# Patient Record
Sex: Male | Born: 1970
Health system: Southern US, Community
[De-identification: ages and names within clinical notes are randomized; demographics above are authoritative.]

## PROBLEM LIST (undated history)

## (undated) DIAGNOSIS — K219 Gastro-esophageal reflux disease without esophagitis: Secondary | ICD-10-CM

## (undated) DIAGNOSIS — S83249A Other tear of medial meniscus, current injury, unspecified knee, initial encounter: Secondary | ICD-10-CM

## (undated) HISTORY — PX: ANTERIOR CRUCIATE LIGAMENT REPAIR: SHX115

## (undated) HISTORY — PX: KNEE ARTHROSCOPY WITH MENISCAL REPAIR: SHX5653

## (undated) HISTORY — PX: ORIF CLAVICLE FRACTURE: SUR924

---

## 2012-02-17 ENCOUNTER — Encounter (HOSPITAL_COMMUNITY): Payer: Self-pay | Admitting: *Deleted

## 2012-02-17 ENCOUNTER — Emergency Department (HOSPITAL_COMMUNITY)
Admission: EM | Admit: 2012-02-17 | Discharge: 2012-02-17 | Disposition: A | Discharge status: Home / Self Care | Payer: Managed Care, Other (non HMO) | Attending: Emergency Medicine | Admitting: Emergency Medicine

## 2012-02-17 DIAGNOSIS — K219 Gastro-esophageal reflux disease without esophagitis: Secondary | ICD-10-CM | POA: Insufficient documentation

## 2012-02-17 DIAGNOSIS — R197 Diarrhea, unspecified: Secondary | ICD-10-CM | POA: Insufficient documentation

## 2012-02-17 HISTORY — DX: Gastro-esophageal reflux disease without esophagitis: K21.9

## 2012-02-17 LAB — URINALYSIS, ROUTINE W REFLEX MICROSCOPIC
Bilirubin Urine: NEGATIVE
Glucose, UA: NEGATIVE mg/dL
Hgb urine dipstick: NEGATIVE
Specific Gravity, Urine: 1.016 (ref 1.005–1.030)
pH: 6 (ref 5.0–8.0)

## 2012-02-17 LAB — CBC WITH DIFFERENTIAL/PLATELET
Eosinophils Absolute: 0 10*3/uL (ref 0.0–0.7)
Eosinophils Relative: 0 % (ref 0–5)
Hemoglobin: 15.3 g/dL (ref 13.0–17.0)
Lymphocytes Relative: 9 % — ABNORMAL LOW (ref 12–46)
Lymphs Abs: 1 10*3/uL (ref 0.7–4.0)
MCH: 30.5 pg (ref 26.0–34.0)
MCV: 83.7 fL (ref 78.0–100.0)
Monocytes Relative: 10 % (ref 3–12)
RBC: 5.02 MIL/uL (ref 4.22–5.81)
WBC: 11.4 10*3/uL — ABNORMAL HIGH (ref 4.0–10.5)

## 2012-02-17 LAB — COMPREHENSIVE METABOLIC PANEL
ALT: 49 U/L (ref 0–53)
AST: 32 U/L (ref 0–37)
Albumin: 4.2 g/dL (ref 3.5–5.2)
Alkaline Phosphatase: 104 U/L (ref 39–117)
Glucose, Bld: 107 mg/dL — ABNORMAL HIGH (ref 70–99)
Potassium: 3.5 mEq/L (ref 3.5–5.1)
Sodium: 133 mEq/L — ABNORMAL LOW (ref 135–145)
Total Protein: 7.5 g/dL (ref 6.0–8.3)

## 2012-02-17 MED ORDER — SODIUM CHLORIDE 0.9 % IV SOLN
1000.0000 mL | INTRAVENOUS | Status: DC
  Administered 2012-02-17: 1000 mL via INTRAVENOUS

## 2012-02-17 MED ORDER — SODIUM CHLORIDE 0.9 % IV SOLN
1000.0000 mL | Freq: Once | INTRAVENOUS | Status: AC
  Administered 2012-02-17: 1000 mL via INTRAVENOUS

## 2012-02-17 MED ORDER — ONDANSETRON HCL 4 MG/2ML IJ SOLN
4.0000 mg | Freq: Once | INTRAMUSCULAR | Status: AC
  Administered 2012-02-17: 4 mg via INTRAVENOUS
  Filled 2012-02-17: qty 2

## 2012-02-17 MED ORDER — LOPERAMIDE HCL 2 MG PO CAPS: 2.0000 mg | ORAL_CAPSULE | Freq: Four times a day (QID) | ORAL | Status: AC | PRN

## 2012-02-17 MED ORDER — ONDANSETRON 8 MG PO TBDP: 8.0000 mg | ORAL_TABLET | Freq: Three times a day (TID) | ORAL | Status: AC | PRN

## 2012-02-17 MED ORDER — MORPHINE SULFATE 4 MG/ML IJ SOLN
4.0000 mg | Freq: Once | INTRAMUSCULAR | Status: AC
  Administered 2012-02-17: 4 mg via INTRAVENOUS
  Filled 2012-02-17: qty 1

## 2012-02-17 NOTE — ED Notes (Signed)
Pt reports Thursday morning had body aches, lethargy and symptoms improved Friday until after dinner. Pt states started to have diarrhea Friday night. Pt states nausea and vomiting starting this AM.  Pt also reports generalized lower abdominal pain. Pt denies history of similar symptoms.  Pt has not been taking anything for symptoms.

## 2012-02-17 NOTE — ED Provider Notes (Signed)
History     CSN: [id number]  Arrival date & time 02/17/12  1108   First MD Initiated Contact with Patient 02/17/12 1133      Chief Complaint  Patient presents with  . Abdominal Pain  . Nausea  . Emesis  . Diarrhea  . Generalized Body Aches  . Fever  . Headache    HPI Pt started having body aches on Thursday.  Thursday and Friday started with diarrhea.  He has had maybe 40 episodes.  No blood in the diarrhea.  Watery and mucus.   Today he started with vomiting.  No blood in urine.  Nothing seems to help.  The symptoms are worsening.  The abdominal pain is cramping all over but mostly toward the lower abdomen.  He also has a headache.  No neck pain.  No travelling.  No camping.  No recent antibiotics. Pt did eat lunch at [facility] this week.  He thought the shrimp smelled fishy.  Past Medical History  Diagnosis Date  . Acid reflux     Past Surgical History  Procedure Date  . Anterior cruciate ligament repair   . Meniscectomy   . Resection distal clavical     No family history on file.  History  Substance Use Topics  . Smoking status: Never Smoker   . Smokeless tobacco: Never Used  . Alcohol Use: Yes      Review of Systems  All other systems reviewed and are negative.    Allergies  Review of patient's allergies indicates no known allergies.  Home Medications  No current outpatient prescriptions on file.  BP 116/70  Pulse 99  Temp 100.8 F (38.2 C)  Resp 20  Wt 164 lb (74.39 kg)  SpO2 100%  Physical Exam  Nursing note and vitals reviewed. Constitutional: He appears well-developed and well-nourished. No distress.  HENT:  Head: Normocephalic and atraumatic.  Right Ear: External ear normal.  Left Ear: External ear normal.  Eyes: Conjunctivae are normal. Right eye exhibits no discharge. Left eye exhibits no discharge. No scleral icterus.  Neck: Full passive range of motion without pain. Neck supple. No rigidity. No tracheal deviation present.    Cardiovascular: Normal rate, regular rhythm and intact distal pulses.   Pulmonary/Chest: Effort normal and breath sounds normal. No stridor. No respiratory distress. He has no wheezes. He has no rales.  Abdominal: Soft. Bowel sounds are normal. He exhibits no distension. There is no tenderness. There is no rebound and no guarding.  Musculoskeletal: He exhibits no edema and no tenderness.  Neurological: He is alert. He has normal strength. No sensory deficit. Cranial nerve deficit:  no gross defecits noted. He exhibits normal muscle tone. He displays no seizure activity. Coordination normal.  Skin: Skin is warm and dry. No rash noted.  Psychiatric: He has a normal mood and affect.    ED Course  Procedures (including critical care time)  Labs Reviewed  COMPREHENSIVE METABOLIC PANEL - Abnormal; Notable for the following:    Sodium 133 (*)     Chloride 95 (*)     Glucose, Bld 107 (*)     All other components within normal limits  URINALYSIS, ROUTINE W REFLEX MICROSCOPIC - Abnormal; Notable for the following:    Ketones, ur TRACE (*)     All other components within normal limits  CBC WITH DIFFERENTIAL - Abnormal; Notable for the following:    WBC 11.4 (*)     MCHC 36.4 (*)     Neutrophils Relative  81 (*)     Neutro Abs 9.2 (*)     Lymphocytes Relative 9 (*)     Monocytes Absolute 1.2 (*)     All other components within normal limits  LIPASE, BLOOD  STOOL CULTURE   No results found.   1. Diarrhea       MDM  Pt without significant tenderness on exam. NO distress here.  No vomiting.  Pt has had multiple episodes of diarrhea.  ?food borne illness.  Pt has not had a diarrhea episode since 9am.  Will dc home with antiemetics and antidiarrheal agents to be used prn. Pt feels better.  At this time there does not appear to be any evidence of an acute emergency medical condition and the patient appears stable for discharge with appropriate outpatient follow up.         Celene Kras, MD 02/17/12 [phone]

## 2015-05-17 DIAGNOSIS — S83249A Other tear of medial meniscus, current injury, unspecified knee, initial encounter: Secondary | ICD-10-CM

## 2015-05-17 HISTORY — DX: Other tear of medial meniscus, current injury, unspecified knee, initial encounter: S83.249A

## 2015-06-01 ENCOUNTER — Other Ambulatory Visit: Payer: Self-pay | Admitting: Sports Medicine

## 2015-06-01 DIAGNOSIS — M25561 Pain in right knee: Secondary | ICD-10-CM

## 2015-06-02 ENCOUNTER — Ambulatory Visit
Admission: RE | Admit: 2015-06-02 | Discharge: 2015-06-02 | Disposition: A | Discharge status: Home / Self Care | Payer: BLUE CROSS/BLUE SHIELD | Source: Ambulatory Visit | Attending: Sports Medicine | Admitting: Sports Medicine

## 2015-06-02 DIAGNOSIS — M25561 Pain in right knee: Secondary | ICD-10-CM

## 2015-06-06 ENCOUNTER — Encounter (HOSPITAL_BASED_OUTPATIENT_CLINIC_OR_DEPARTMENT_OTHER): Payer: Self-pay | Admitting: *Deleted

## 2015-06-06 ENCOUNTER — Other Ambulatory Visit (HOSPITAL_BASED_OUTPATIENT_CLINIC_OR_DEPARTMENT_OTHER): Payer: Self-pay | Admitting: Orthopaedic Surgery

## 2015-06-08 ENCOUNTER — Ambulatory Visit (HOSPITAL_BASED_OUTPATIENT_CLINIC_OR_DEPARTMENT_OTHER): Payer: BLUE CROSS/BLUE SHIELD | Admitting: Certified Registered"

## 2015-06-08 ENCOUNTER — Encounter (HOSPITAL_BASED_OUTPATIENT_CLINIC_OR_DEPARTMENT_OTHER): Payer: Self-pay | Admitting: Certified Registered"

## 2015-06-08 ENCOUNTER — Ambulatory Visit (HOSPITAL_BASED_OUTPATIENT_CLINIC_OR_DEPARTMENT_OTHER)
Admission: RE | Admit: 2015-06-08 | Discharge: 2015-06-08 | Disposition: A | Discharge status: Home / Self Care | Payer: BLUE CROSS/BLUE SHIELD | Source: Ambulatory Visit | Attending: Orthopaedic Surgery | Admitting: Orthopaedic Surgery

## 2015-06-08 ENCOUNTER — Encounter (HOSPITAL_BASED_OUTPATIENT_CLINIC_OR_DEPARTMENT_OTHER)
Admission: RE | Disposition: A | Discharge status: Home / Self Care | Payer: Self-pay | Source: Ambulatory Visit | Attending: Orthopaedic Surgery

## 2015-06-08 DIAGNOSIS — K219 Gastro-esophageal reflux disease without esophagitis: Secondary | ICD-10-CM | POA: Insufficient documentation

## 2015-06-08 DIAGNOSIS — M65861 Other synovitis and tenosynovitis, right lower leg: Secondary | ICD-10-CM | POA: Insufficient documentation

## 2015-06-08 DIAGNOSIS — M23231 Derangement of other medial meniscus due to old tear or injury, right knee: Secondary | ICD-10-CM | POA: Insufficient documentation

## 2015-06-08 HISTORY — DX: Other tear of medial meniscus, current injury, unspecified knee, initial encounter: S83.249A

## 2015-06-08 HISTORY — PX: KNEE ARTHROSCOPY WITH MEDIAL MENISECTOMY: SHX5651

## 2015-06-08 SURGERY — ARTHROSCOPY, KNEE, WITH MEDIAL MENISCECTOMY
Anesthesia: General | Site: Knee | Laterality: Right

## 2015-06-08 MED ORDER — MIDAZOLAM HCL 2 MG/2ML IJ SOLN
1.0000 mg | INTRAMUSCULAR | Status: DC | PRN
  Administered 2015-06-08: 2 mg via INTRAVENOUS
  Administered 2015-06-08: 1 mg via INTRAVENOUS

## 2015-06-08 MED ORDER — CEFAZOLIN SODIUM-DEXTROSE 2-3 GM-% IV SOLR
INTRAVENOUS | Status: AC
  Filled 2015-06-08: qty 50

## 2015-06-08 MED ORDER — PROPOFOL 10 MG/ML IV BOLUS
INTRAVENOUS | Status: DC | PRN
  Administered 2015-06-08: 200 mg via INTRAVENOUS

## 2015-06-08 MED ORDER — HYDROCODONE-ACETAMINOPHEN 5-325 MG PO TABS: 1.0000 | ORAL_TABLET | Freq: Four times a day (QID) | ORAL | Status: AC | PRN

## 2015-06-08 MED ORDER — KETOROLAC TROMETHAMINE 30 MG/ML IJ SOLN
INTRAMUSCULAR | Status: AC
  Filled 2015-06-08: qty 1

## 2015-06-08 MED ORDER — PROMETHAZINE HCL 25 MG/ML IJ SOLN: 6.2500 mg | INTRAMUSCULAR | Status: DC | PRN

## 2015-06-08 MED ORDER — FENTANYL CITRATE (PF) 100 MCG/2ML IJ SOLN
INTRAMUSCULAR | Status: AC
  Filled 2015-06-08: qty 2

## 2015-06-08 MED ORDER — CEFAZOLIN SODIUM-DEXTROSE 2-3 GM-% IV SOLR
2.0000 g | INTRAVENOUS | Status: AC
  Administered 2015-06-08: 2 g via INTRAVENOUS

## 2015-06-08 MED ORDER — ONDANSETRON HCL 4 MG/2ML IJ SOLN
INTRAMUSCULAR | Status: AC
  Filled 2015-06-08: qty 2

## 2015-06-08 MED ORDER — BUPIVACAINE HCL (PF) 0.25 % IJ SOLN
INTRAMUSCULAR | Status: AC
  Filled 2015-06-08: qty 30

## 2015-06-08 MED ORDER — LIDOCAINE HCL (CARDIAC) 20 MG/ML IV SOLN
INTRAVENOUS | Status: DC | PRN
  Administered 2015-06-08: 60 mg via INTRAVENOUS

## 2015-06-08 MED ORDER — FENTANYL CITRATE (PF) 100 MCG/2ML IJ SOLN
25.0000 ug | INTRAMUSCULAR | Status: DC | PRN
  Administered 2015-06-08 (×2): 50 ug via INTRAVENOUS

## 2015-06-08 MED ORDER — MIDAZOLAM HCL 2 MG/2ML IJ SOLN
INTRAMUSCULAR | Status: AC
  Filled 2015-06-08: qty 2

## 2015-06-08 MED ORDER — ONDANSETRON HCL 4 MG/2ML IJ SOLN
INTRAMUSCULAR | Status: DC | PRN
  Administered 2015-06-08: 4 mg via INTRAVENOUS

## 2015-06-08 MED ORDER — SCOPOLAMINE 1 MG/3DAYS TD PT72: 1.0000 | MEDICATED_PATCH | Freq: Once | TRANSDERMAL | Status: DC | PRN

## 2015-06-08 MED ORDER — SODIUM CHLORIDE 0.9 % IR SOLN
Status: DC | PRN
  Administered 2015-06-08: 6000 mL

## 2015-06-08 MED ORDER — DEXAMETHASONE SODIUM PHOSPHATE 4 MG/ML IJ SOLN
INTRAMUSCULAR | Status: DC | PRN
  Administered 2015-06-08: 10 mg via INTRAVENOUS

## 2015-06-08 MED ORDER — GLYCOPYRROLATE 0.2 MG/ML IJ SOLN: 0.2000 mg | Freq: Once | INTRAMUSCULAR | Status: DC | PRN

## 2015-06-08 MED ORDER — DEXAMETHASONE SODIUM PHOSPHATE 10 MG/ML IJ SOLN
INTRAMUSCULAR | Status: AC
  Filled 2015-06-08: qty 1

## 2015-06-08 MED ORDER — FENTANYL CITRATE (PF) 100 MCG/2ML IJ SOLN
50.0000 ug | INTRAMUSCULAR | Status: DC | PRN
  Administered 2015-06-08: 100 ug via INTRAVENOUS

## 2015-06-08 MED ORDER — BUPIVACAINE HCL 0.25 % IJ SOLN
INTRAMUSCULAR | Status: DC | PRN
  Administered 2015-06-08: 17 mL

## 2015-06-08 MED ORDER — LACTATED RINGERS IV SOLN
INTRAVENOUS | Status: DC
  Administered 2015-06-08: 10 mL/h via INTRAVENOUS
  Administered 2015-06-08: 10:00:00 via INTRAVENOUS

## 2015-06-08 MED ORDER — KETOROLAC TROMETHAMINE 30 MG/ML IJ SOLN
30.0000 mg | Freq: Once | INTRAMUSCULAR | Status: AC
  Administered 2015-06-08: 30 mg via INTRAVENOUS

## 2015-06-08 MED ORDER — LIDOCAINE HCL (CARDIAC) 20 MG/ML IV SOLN
INTRAVENOUS | Status: AC
  Filled 2015-06-08: qty 5

## 2015-06-08 SURGICAL SUPPLY — 38 items
BANDAGE ELASTIC 6 VELCRO ST LF (GAUZE/BANDAGES/DRESSINGS) ×3 IMPLANT
BANDAGE ESMARK 6X9 LF (GAUZE/BANDAGES/DRESSINGS) IMPLANT
BLADE 4.2CUDA (BLADE) ×3 IMPLANT
BLADE CUDA GRT WHITE 3.5 (BLADE) ×3 IMPLANT
BLADE CUDA SHAVER 3.5 (BLADE) IMPLANT
BLADE CUTTER GATOR 3.5 (BLADE) IMPLANT
BNDG ESMARK 6X9 LF (GAUZE/BANDAGES/DRESSINGS)
CUFF TOURNIQUET SINGLE 34IN LL (TOURNIQUET CUFF) IMPLANT
DRAPE ARTHROSCOPY W/POUCH 90 (DRAPES) ×3 IMPLANT
DRAPE SURG 17X23 STRL (DRAPES) ×6 IMPLANT
DURAPREP 26ML APPLICATOR (WOUND CARE) ×3 IMPLANT
ELECT MENISCUS 165MM 90D (ELECTRODE) IMPLANT
ELECT REM PT RETURN 9FT ADLT (ELECTROSURGICAL)
ELECTRODE REM PT RTRN 9FT ADLT (ELECTROSURGICAL) IMPLANT
GAUZE SPONGE 4X4 12PLY STRL (GAUZE/BANDAGES/DRESSINGS) ×3 IMPLANT
GAUZE XEROFORM 1X8 LF (GAUZE/BANDAGES/DRESSINGS) ×3 IMPLANT
GLOVE BIOGEL PI IND STRL 7.0 (GLOVE) ×2 IMPLANT
GLOVE BIOGEL PI INDICATOR 7.0 (GLOVE) ×4
GLOVE ECLIPSE 6.5 STRL STRAW (GLOVE) ×3 IMPLANT
GLOVE NEODERM STRL 7.5 LF PF (GLOVE) ×1 IMPLANT
GLOVE SURG NEODERM 7.5  LF PF (GLOVE) ×2
GLOVE SURG SYN 7.5  E (GLOVE) ×2
GLOVE SURG SYN 7.5 E (GLOVE) ×1 IMPLANT
GOWN STRL REIN XL XLG (GOWN DISPOSABLE) ×3 IMPLANT
GOWN STRL REUS W/ TWL LRG LVL3 (GOWN DISPOSABLE) ×1 IMPLANT
GOWN STRL REUS W/TWL LRG LVL3 (GOWN DISPOSABLE) ×2
KNEE WRAP E Z 3 GEL PACK (MISCELLANEOUS) ×3 IMPLANT
MANIFOLD NEPTUNE II (INSTRUMENTS) ×3 IMPLANT
PACK ARTHROSCOPY DSU (CUSTOM PROCEDURE TRAY) ×3 IMPLANT
PACK BASIN DAY SURGERY FS (CUSTOM PROCEDURE TRAY) ×3 IMPLANT
PENCIL BUTTON HOLSTER BLD 10FT (ELECTRODE) IMPLANT
RESECTOR FULL RADIUS 4.2MM (BLADE) IMPLANT
SLEEVE SCD COMPRESS KNEE MED (MISCELLANEOUS) IMPLANT
SUT ETHILON 3 0 PS 1 (SUTURE) ×3 IMPLANT
TOWEL OR 17X24 6PK STRL BLUE (TOWEL DISPOSABLE) ×3 IMPLANT
TOWEL OR NON WOVEN STRL DISP B (DISPOSABLE) IMPLANT
TUBING ARTHROSCOPY IRRIG 16FT (MISCELLANEOUS) ×3 IMPLANT
WATER STERILE IRR 1000ML POUR (IV SOLUTION) ×3 IMPLANT

## 2015-06-08 NOTE — Anesthesia Preprocedure Evaluation (Signed)
Anesthesia Evaluation  Patient identified by MRN, date of birth, ID band Patient awake    Reviewed: Allergy & Precautions, NPO status , Patient's Chart, lab work & pertinent test results  Airway Mallampati: II  TM Distance: >3 FB Neck ROM: Full    Dental no notable dental hx.    Pulmonary neg pulmonary ROS,    Pulmonary exam normal breath sounds clear to auscultation       Cardiovascular negative cardio ROS Normal cardiovascular exam Rhythm:Regular Rate:Normal     Neuro/Psych negative neurological ROS  negative psych ROS   GI/Hepatic Neg liver ROS, GERD  Medicated,  Endo/Other  negative endocrine ROS  Renal/GU negative Renal ROS  negative genitourinary   Musculoskeletal negative musculoskeletal ROS (+)   Abdominal   Peds negative pediatric ROS (+)  Hematology negative hematology ROS (+)   Anesthesia Other Findings   Reproductive/Obstetrics negative OB ROS                             Anesthesia Physical Anesthesia Plan  ASA: II  Anesthesia Plan: General   Post-op Pain Management:    Induction: Intravenous  Airway Management Planned: LMA  Additional Equipment:   Intra-op Plan:   Post-operative Plan: Extubation in OR  Informed Consent: I have reviewed the patients History and Physical, chart, labs and discussed the procedure including the risks, benefits and alternatives for the proposed anesthesia with the patient or authorized representative who has indicated his/her understanding and acceptance.   Dental advisory given  Plan Discussed with: CRNA and Surgeon  Anesthesia Plan Comments:         Anesthesia Quick Evaluation  

## 2015-06-08 NOTE — H&P (Signed)
    PREOPERATIVE H&P  Chief Complaint: Right knee medial meniscal tear  HPI: Jerome Cole is a 44 y.o. male who presents for surgical treatment of Right knee medial meniscal tear.  He denies any changes in medical history.  Past Medical History  Diagnosis Date  . Acid reflux   . Medial meniscus tear 05/2015    right knee   Past Surgical History  Procedure Laterality Date  . Anterior cruciate ligament repair Bilateral   . Orif clavicle fracture    . Knee arthroscopy with meniscal repair Left    Social History   Social History  . Marital Status: Married    Spouse Name: N/A  . Number of Children: N/A  . Years of Education: N/A   Social History Main Topics  . Smoking status: Never Smoker   . Smokeless tobacco: Never Used  . Alcohol Use: Yes     Comment: occasionally  . Drug Use: No  . Sexual Activity: Not Asked   Other Topics Concern  . None   Social History Narrative   History reviewed. No pertinent family history. No Known Allergies Prior to Admission medications   Medication Sig Start Date End Date Taking? Authorizing Provider  ibuprofen (ADVIL,MOTRIN) 200 MG tablet Take 200 mg by mouth every 6 (six) hours as needed.   Yes Historical Provider, MD  omeprazole (PRILOSEC) 20 MG capsule Take 20 mg by mouth daily.   Yes Historical Provider, MD     Positive ROS: All other systems have been reviewed and were otherwise negative with the exception of those mentioned in the HPI and as above.  Physical Exam: General: Alert, no acute distress Cardiovascular: No pedal edema Respiratory: No cyanosis, no use of accessory musculature GI: abdomen soft Skin: No lesions in the area of chief complaint Neurologic: Sensation intact distally Psychiatric: Patient is competent for consent with normal mood and affect Lymphatic: no lymphedema  MUSCULOSKELETAL: exam stable  Assessment: Right knee medial meniscal tear  Plan: Plan for Procedure(s): RIGHT KNEE ARTHROSCOPY WITH  PARTIAL MEDIAL MENISCECTOMY  The risks benefits and alternatives were discussed with the patient including but not limited to the risks of nonoperative treatment, versus surgical intervention including infection, bleeding, nerve injury,  blood clots, cardiopulmonary complications, morbidity, mortality, among others, and they were willing to proceed.   Cheral AlmasXu, Naiping Michael, MD   06/08/2015 6:47 AM

## 2015-06-08 NOTE — Discharge Instructions (Signed)
° ° °Post-operative patient instructions  °Knee Arthroscopy  ° °• Ice:  Place intermittent ice or cooler pack over your knee, 30 minutes on and 30 minutes off.  Continue this for the first 72 hours after surgery, then save ice for use after therapy sessions or on more active days.   °• Weight:  You may bear weight on your leg as your symptoms allow. °• Crutches:  Use crutches (or walker) to assist in walking until told to discontinue by your physical therapist or physician. This will help to reduce pain. °• Strengthening:  Perform simple thigh squeezes (isometric quad contractions) and straight leg lifts as you are able (3 sets of 5 to 10 repetitions, 3 times a day).  For the leg lifts, have someone support under your ankle in the beginning until you have increased strength enough to do this on your own.  To help get started on thigh squeezes, place a pillow under your knee and push down on the pillow with back of knee (sometimes easier to do than with your leg fully straight). °• Motion:  Perform gentle knee motion as tolerated - this is gentle bending and straightening of the knee. Seated heel slides: you can start by sitting in a chair, remove your brace, and gently slide your heel back on the floor - allowing your knee to bend. Have someone help you straighten your knee (or use your other leg/foot hooked under your ankle.  °• Dressing:  Perform 1st dressing change at 2 days postoperative. A moderate amount of blood tinged drainage is to be expected.  So if you bleed through the dressing on the first or second day or if you have fevers, it is fine to change the dressing/check the wounds early and redress wound. Elevate your leg.  If it bleeds through again, or if the incisions are leaking frank blood, please call the office. May change dressing every 1-2 days thereafter to help watch wounds. Can purchase Tegderm (or 3M Nexcare) water resistant dressings at local pharmacy / [facility]. °• Shower:  Light shower is ok  after 2 days.  Please take shower, NO bath. Recover with gauze and ace wrap to help keep wounds protected.   °• Pain medication:  A narcotic pain medication has been prescribed.  Take as directed.  Typically you need narcotic pain medication more regularly during the first 3 to 5 days after surgery.  Decrease your use of the medication as the pain improves.  Narcotics can sometimes cause constipation, even after a few doses.  If you have problems with constipation, you can take an over the counter stool softener or light laxative.  If you have persistent problems, please notify your physician’s office. °• Physical therapy: Additional activity guidelines to be provided by your physician or physical therapist at follow-up visits.  °• Driving: Do not recommend driving x 2 weeks post surgical, especially if surgery performed on right side. Should not drive while taking narcotic pain medications. It typically takes at least 2 weeks to restore sufficient neuromuscular function for normal reaction times for driving safety.  °• Call [phone] for questions or problems. Evenings you will be forwarded to the hospital operator.  Ask for the orthopaedic physician on call. Please call if you experience:  °  °o Redness, foul smelling, or persistent drainage from the surgical site  °o worsening knee pain and swelling not responsive to medication  °o any calf pain and or swelling of the lower leg  °o temperatures greater than   100.5 F °o other questions or concerns ° ° °Thank you for allowing us to be a part of your care. ° °

## 2015-06-08 NOTE — Anesthesia Procedure Notes (Signed)
Procedure Name: LMA Insertion Date/Time: 06/08/2015 9:24 AM Performed by: Curly ShoresRAFT, Lunell Robart W Pre-anesthesia Checklist: Patient identified, Emergency Drugs available, Suction available and Patient being monitored Patient Re-evaluated:Patient Re-evaluated prior to inductionOxygen Delivery Method: Circle System Utilized Preoxygenation: Pre-oxygenation with 100% oxygen Intubation Type: IV induction Ventilation: Mask ventilation without difficulty LMA: LMA inserted LMA Size: 4.0 Number of attempts: 1 Airway Equipment and Method: Bite block Placement Confirmation: positive ETCO2 and breath sounds checked- equal and bilateral Tube secured with: Tape Dental Injury: Teeth and Oropharynx as per pre-operative assessment

## 2015-06-08 NOTE — Transfer of Care (Signed)
Immediate Anesthesia Transfer of Care Note  Patient: Jerome Cole  Procedure(s) Performed: Procedure(s): RIGHT KNEE ARTHROSCOPY WITH PARTIAL MEDIAL MENISCECTOMY (Right)  Patient Location: PACU  Anesthesia Type:General  Level of Consciousness: awake, sedated and responds to stimulation  Airway & Oxygen Therapy: Patient Spontanous Breathing and Patient connected to face mask oxygen  Post-op Assessment: Report given to RN, Post -op Vital signs reviewed and stable and Patient moving all extremities  Post vital signs: Reviewed and stable  Last Vitals:  Filed Vitals:   06/08/15 0841  BP: 158/83  Pulse: 68  Temp: 36.6 C  Resp: 20    Complications: No apparent anesthesia complications

## 2015-06-08 NOTE — Op Note (Signed)
   Surgery Date: 06/08/2015  Surgeon(s): Naiping Donnelly StagerM Xu, MD  ANESTHESIA:  general  FLUIDS: Per anesthesia record.   ESTIMATED BLOOD LOSS: minimal  PREOPERATIVE DIAGNOSES:  1. Right knee medial meniscus tear 2.  knee synovitis  POSTOPERATIVE DIAGNOSES:  same  PROCEDURES PERFORMED:  1. right knee arthroscopy with limited synovectomy 2. knee arthroscopy with arthroscopic partial right meniscectomy  DESCRIPTION OF PROCEDURE: Mr. Kayren EavesLanglois is a 44 y.o.-year-old male with right knee medial meniscus tear. Plans are to proceed with partial medial meniscectomy and diagnostic arthroscopy with debridement as indicated. Full discussion held regarding risks benefits alternatives and complications related surgical intervention. Conservative care options reviewed. All questions answered.  The patient was identified in the preoperative holding area and the operative extremity was marked. The patient was brought to the operating room and transferred to operating table in a supine position. Satisfactory general anesthesia was induced by anesthesiology.    Standard anterolateral, anteromedial arthroscopy portals were obtained. The anteromedial portal was obtained with a spinal needle for localization under direct visualization with subsequent diagnostic findings.   Anteromedial and anterolateral chambers: mild synovitis. The synovitis was debrided with a 4.5 mm full radius shaver through both the anteromedial and lateral portals.   Suprapatellar pouch and gutters: mild synovitis or debris. Patella chondral surface: Grade 0 Trochlear chondral surface: Grade 0 Patellofemoral tracking: normal Medial meniscus: bucket handle tear.  Medial femoral condyle flexion bearing surface: Grade 1 Medial femoral condyle extension bearing surface: Grade 1 Medial tibial plateau: Grade 1 Anterior cruciate ligament:stable Posterior cruciate ligament:stable Lateral meniscus: normal.   Lateral femoral condyle flexion  bearing surface: Grade 0 Lateral femoral condyle extension bearing surface: Grade 0 Lateral tibial plateau: Grade 0  After completion of synovectomy, diagnostic exam, and debridements as described, all compartments were checked and no residual debris remained. Hemostasis was achieved with the cautery wand. The portals were approximated with interrupted nylon suture. All excess fluid was expressed from the joint. The portals were approximated with interrupted nylon suture. Xeroform sterile gauze dressings were applied followed by Ace bandage and ice pack.   DISPOSITION: The patient was awakened from general anesthetic, extubated, taken to the recovery room in medically stable condition, no apparent complications. The patient may be weightbearing as tolerated to the operative lower extremity.  Range of motion of right knee as tolerated.  Mayra ReelN. Michael Xu, MD [facility] [phone] 10:02 AM

## 2015-06-08 NOTE — Anesthesia Postprocedure Evaluation (Signed)
Anesthesia Post Note  Patient: Jerome HatchDave Cole  Procedure(s) Performed: Procedure(s) (LRB): RIGHT KNEE ARTHROSCOPY WITH PARTIAL MEDIAL MENISCECTOMY (Right)  Patient location during evaluation: PACU Anesthesia Type: General Level of consciousness: awake and alert Pain management: pain level controlled Vital Signs Assessment: post-procedure vital signs reviewed and stable Respiratory status: spontaneous breathing, nonlabored ventilation, respiratory function stable and patient connected to nasal cannula oxygen Cardiovascular status: blood pressure returned to baseline and stable Postop Assessment: No signs of nausea or vomiting Anesthetic complications: no    Last Vitals:  Filed Vitals:   06/08/15 0841 06/08/15 1015  BP: 158/83   Pulse: 68 79  Temp: 36.6 C   Resp: 20 14    Last Pain:  Filed Vitals:   06/08/15 1018  PainSc: 4                  Rubby Barbary S

## 2015-06-13 ENCOUNTER — Encounter (HOSPITAL_BASED_OUTPATIENT_CLINIC_OR_DEPARTMENT_OTHER): Payer: Self-pay | Admitting: Orthopaedic Surgery

## 2016-05-10 DIAGNOSIS — M25512 Pain in left shoulder: Secondary | ICD-10-CM | POA: Diagnosis not present

## 2016-05-25 ENCOUNTER — Ambulatory Visit (INDEPENDENT_AMBULATORY_CARE_PROVIDER_SITE_OTHER): Payer: Self-pay

## 2016-05-25 ENCOUNTER — Encounter (INDEPENDENT_AMBULATORY_CARE_PROVIDER_SITE_OTHER): Payer: Self-pay | Admitting: Orthopaedic Surgery

## 2016-05-25 ENCOUNTER — Ambulatory Visit (INDEPENDENT_AMBULATORY_CARE_PROVIDER_SITE_OTHER): Payer: BLUE CROSS/BLUE SHIELD | Admitting: Orthopaedic Surgery

## 2016-05-25 DIAGNOSIS — M79644 Pain in right finger(s): Secondary | ICD-10-CM

## 2016-05-25 DIAGNOSIS — G8929 Other chronic pain: Secondary | ICD-10-CM | POA: Diagnosis not present

## 2016-05-25 DIAGNOSIS — M7542 Impingement syndrome of left shoulder: Secondary | ICD-10-CM | POA: Diagnosis not present

## 2016-05-25 DIAGNOSIS — M25511 Pain in right shoulder: Secondary | ICD-10-CM | POA: Diagnosis not present

## 2016-05-25 NOTE — Progress Notes (Signed)
Office Visit Note   Patient: Jerome HatchDave Cole           Date of Birth: 02/14/1971           MRN: [id number] Visit Date: 05/25/2016              Requested by: Joycelyn RuaStephen Meyers, MD [address] [place], [place] [address] PCP: Joycelyn RuaMEYERS, STEPHEN, MD   Assessment & Plan: Visit Diagnoses:  1. Chronic right shoulder pain   2. Impingement syndrome of left shoulder   3. Pain in right finger(s)     Plan:  - subacromial injection given today - rest, ice, nsaids - f/u 4 weeks for recheck, may consider MRI  Follow-Up Instructions: Return in about 4 weeks (around 06/22/2016) for recheck left shoulder.   Orders:  Orders Placed This Encounter  Procedures  . XR Shoulder Left  . XR Finger Little Right   No orders of the defined types were placed in this encounter.     Procedures: No procedures performed   Clinical Data: No additional findings.   Subjective: Chief Complaint  Patient presents with  . Left Shoulder - Pain  . Right Little Finger - Pain    45 yo male whose knee I scoped comes in for left shoulder pain since knee scope.  Shoulder feels tender just posterior to the posterolateral corner of acromion.  He's been going to the gym which makes pain worse.  Pain doesn't radiate.  Advil helps pain.  prednisone taper also helped.      Review of Systems  Constitutional: Negative.   HENT: Negative.   Eyes: Negative.   Respiratory: Negative.   Cardiovascular: Negative.   Gastrointestinal: Negative.   Endocrine: Negative.   Genitourinary: Negative.   Musculoskeletal: Negative.   Skin: Negative.   Allergic/Immunologic: Negative.   Neurological: Negative.   Hematological: Negative.   Psychiatric/Behavioral: Negative.      Objective: Vital Signs: There were no vitals taken for this visit.  Physical Exam  Constitutional: He is oriented to person, place, and time. He appears well-developed and well-nourished.  HENT:  Head: Normocephalic and atraumatic.  Eyes: EOM  are normal.  Neck: Neck supple.  Cardiovascular: Intact distal pulses.   Pulmonary/Chest: Effort normal.  Abdominal: Soft.  Musculoskeletal:       Left knee: He exhibits no effusion.  Neurological: He is alert and oriented to person, place, and time.  Skin: Skin is warm.  Psychiatric: He has a normal mood and affect. His behavior is normal. Judgment and thought content normal.  Nursing note and vitals reviewed.   Left Knee Exam   Other  Effusion: no effusion present   Right Shoulder Exam   Tenderness  The patient is experiencing tenderness in the acromion.  Range of Motion  The patient has normal right shoulder ROM.  Muscle Strength  The patient has normal right shoulder strength.  Tests  Apprehension: negative Cross Arm: negative Drop Arm: negative Hawkin's test: positive Impingement: positive Sulcus: absent  Other  Sensation: normal Pulse: present  Comments:  Very positive Hawkin's sign      Specialty Comments:  No specialty comments available.  Imaging: No results found.   PMFS History: Patient Active Problem List   Diagnosis Date Noted  . Impingement syndrome of left shoulder 05/25/2016   Past Medical History:  Diagnosis Date  . Acid reflux   . Medial meniscus tear 05/2015   right knee    History reviewed. No pertinent family history.  Past  Surgical History:  Procedure Laterality Date  . ANTERIOR CRUCIATE LIGAMENT REPAIR Bilateral   . KNEE ARTHROSCOPY WITH MEDIAL MENISECTOMY Right 06/08/2015   Procedure: RIGHT KNEE ARTHROSCOPY WITH PARTIAL MEDIAL MENISCECTOMY;  Surgeon: Tarry KosNaiping M Alphonse Asbridge, MD;  Location: [facility];  Service: Orthopedics;  Laterality: Right;  . KNEE ARTHROSCOPY WITH MENISCAL REPAIR Left   . ORIF CLAVICLE FRACTURE     Social History   Occupational History  . Not on file.   Social History Main Topics  . Smoking status: Never Smoker  . Smokeless tobacco: Never Used  . Alcohol use Yes     Comment:  occasionally  . Drug use: No  . Sexual activity: Not on file

## 2016-05-26 ENCOUNTER — Encounter (INDEPENDENT_AMBULATORY_CARE_PROVIDER_SITE_OTHER): Payer: Self-pay | Admitting: Orthopaedic Surgery

## 2016-06-22 ENCOUNTER — Ambulatory Visit (INDEPENDENT_AMBULATORY_CARE_PROVIDER_SITE_OTHER): Payer: BLUE CROSS/BLUE SHIELD | Admitting: Orthopaedic Surgery

## 2016-06-29 ENCOUNTER — Encounter (INDEPENDENT_AMBULATORY_CARE_PROVIDER_SITE_OTHER): Payer: Self-pay

## 2016-06-29 ENCOUNTER — Encounter (INDEPENDENT_AMBULATORY_CARE_PROVIDER_SITE_OTHER): Payer: Self-pay | Admitting: Orthopaedic Surgery

## 2016-06-29 ENCOUNTER — Ambulatory Visit (INDEPENDENT_AMBULATORY_CARE_PROVIDER_SITE_OTHER): Payer: BLUE CROSS/BLUE SHIELD | Admitting: Orthopaedic Surgery

## 2016-06-29 DIAGNOSIS — M79644 Pain in right finger(s): Secondary | ICD-10-CM

## 2016-06-29 DIAGNOSIS — M7542 Impingement syndrome of left shoulder: Secondary | ICD-10-CM

## 2016-06-29 NOTE — Progress Notes (Signed)
   Office Visit Note   Patient: Jerome HatchDave Barna           Date of Birth: 09/08/1970           MRN: [id number] Visit Date: 06/29/2016              Requested by: Joycelyn RuaStephen Meyers, MD [address] [place], [place] [address] PCP: Joycelyn RuaMEYERS, STEPHEN, MD   Assessment & Plan: Visit Diagnoses: No diagnosis found.  Plan: In terms of the shoulder I think he should continue to rest and relatively and modify his workout based on this. For the finger looks okay think he likely just fusing sprained it needs to treat this symptomatically should get better as several weeks to months see him back as needed  Follow-Up Instructions: Return if symptoms worsen or fail to improve.   Orders:  No orders of the defined types were placed in this encounter.  No orders of the defined types were placed in this encounter.     Procedures: No procedures performed   Clinical Data: No additional findings.   Subjective: Chief Complaint  Patient presents with  . Left Shoulder - Pain, Follow-up  . Right Little Finger - Pain, Follow-up    Patient follows up today for left shoulder and right small finger pain. The shoulder is about 75% better he is able to do a lot more with the shoulder after the injection. The right small finger continues to be tender to palpation over the DIP joint. Denies any constitutional symptoms.    Review of Systems Complete review of systems negative except for history of present illness  Objective: Vital Signs: There were no vitals taken for this visit.  Physical Exam  Ortho Exam Exam of the left shoulder shows negative Hawkins sign compared to a very positive Hawkins sign previous visit. Rotator cuff is intact. Exam of the right small finger shows slight tenderness over the dorsomedial aspect of the DIP slightly swollen signs see any signs of infection or any aggressive features. He has full range of motion. Specialty Comments:  No specialty comments  available.  Imaging: No results found.   PMFS History: Patient Active Problem List   Diagnosis Date Noted  . Impingement syndrome of left shoulder 05/25/2016   Past Medical History:  Diagnosis Date  . Acid reflux   . Medial meniscus tear 05/2015   right knee    History reviewed. No pertinent family history.  Past Surgical History:  Procedure Laterality Date  . ANTERIOR CRUCIATE LIGAMENT REPAIR Bilateral   . KNEE ARTHROSCOPY WITH MEDIAL MENISECTOMY Right 06/08/2015   Procedure: RIGHT KNEE ARTHROSCOPY WITH PARTIAL MEDIAL MENISCECTOMY;  Surgeon: Tarry KosNaiping M Bayler Nehring, MD;  Location: [facility];  Service: Orthopedics;  Laterality: Right;  . KNEE ARTHROSCOPY WITH MENISCAL REPAIR Left   . ORIF CLAVICLE FRACTURE     Social History   Occupational History  . Not on file.   Social History Main Topics  . Smoking status: Never Smoker  . Smokeless tobacco: Never Used  . Alcohol use Yes     Comment: occasionally  . Drug use: No  . Sexual activity: Not on file

## 2017-01-14 DIAGNOSIS — M542 Cervicalgia: Secondary | ICD-10-CM | POA: Diagnosis not present

## 2017-03-28 DIAGNOSIS — Z23 Encounter for immunization: Secondary | ICD-10-CM | POA: Diagnosis not present

## 2017-08-19 ENCOUNTER — Ambulatory Visit (INDEPENDENT_AMBULATORY_CARE_PROVIDER_SITE_OTHER): Payer: BLUE CROSS/BLUE SHIELD | Admitting: Orthopaedic Surgery

## 2017-08-19 ENCOUNTER — Ambulatory Visit (INDEPENDENT_AMBULATORY_CARE_PROVIDER_SITE_OTHER): Payer: BLUE CROSS/BLUE SHIELD

## 2017-08-19 ENCOUNTER — Encounter (INDEPENDENT_AMBULATORY_CARE_PROVIDER_SITE_OTHER): Payer: Self-pay | Admitting: Orthopaedic Surgery

## 2017-08-19 DIAGNOSIS — M25562 Pain in left knee: Secondary | ICD-10-CM

## 2017-08-19 MED ORDER — METHYLPREDNISOLONE ACETATE 40 MG/ML IJ SUSP
40.0000 mg | INTRAMUSCULAR | Status: AC | PRN
  Administered 2017-08-19: 40 mg via INTRA_ARTICULAR

## 2017-08-19 MED ORDER — BUPIVACAINE HCL 0.5 % IJ SOLN
2.0000 mL | INTRAMUSCULAR | Status: AC | PRN
  Administered 2017-08-19: 2 mL via INTRA_ARTICULAR

## 2017-08-19 MED ORDER — LIDOCAINE HCL 1 % IJ SOLN
2.0000 mL | INTRAMUSCULAR | Status: AC | PRN
  Administered 2017-08-19: 2 mL

## 2017-08-19 NOTE — Progress Notes (Signed)
Office Visit Note   Patient: Jerome Cole           Date of Birth: 17-Jan-1971           MRN: [id number] Visit Date: 08/19/2017              Requested by: Joycelyn Rua, MD [address] [place], [place] [address] PCP: Joycelyn Rua, MD   Assessment & Plan: Visit Diagnoses:  1. Acute pain of left knee     Plan: Impression is 47 year old gentleman with acute left knee injury and effusion.  15 cc of blood-tinged joint fluid was aspirated and then injected with cortisone.  Given prior surgical history and suspicion for medial meniscal tear recommend MRI to fully evaluate for this.  Follow-up after the MRI.  Follow-Up Instructions: Return in about 10 days (around 08/29/2017).   Orders:  Orders Placed This Encounter  Procedures  . XR KNEE 3 VIEW LEFT  . MR Knee Left w/o contrast   No orders of the defined types were placed in this encounter.     Procedures: Large Joint Inj: L knee on 08/19/2017 3:55 PM Details: 22 G needle Medications: 2 mL bupivacaine 0.5 %; 2 mL lidocaine 1 %; 40 mg methylPREDNISolone acetate 40 MG/ML Aspirate: 15 mL blood-tinged Outcome: tolerated well, no immediate complications Patient was prepped and draped in the usual sterile fashion.       Clinical Data: No additional findings.   Subjective: Chief Complaint  Patient presents with  . Left Knee - Pain, Injury, Edema    Jerome Cole is a very pleasant 47 year old gentleman who I took care of couple years ago for a right meniscus tear.  He comes in with a new problem today of an acute left knee injury.  He jumped and when he landed his knee buckled medially and he felt a pop.  He has resultant swelling.  Denies any numbness and tingling.  He is status post ACL reconstruction many years ago.    Review of Systems  Constitutional: Negative.   All other systems reviewed and are negative.    Objective: Vital Signs: There were no vitals taken for this visit.  Physical Exam  Constitutional:  He is oriented to person, place, and time. He appears well-developed and well-nourished.  Pulmonary/Chest: Effort normal.  Abdominal: Soft.  Neurological: He is alert and oriented to person, place, and time.  Skin: Skin is warm.  Psychiatric: He has a normal mood and affect. His behavior is normal. Judgment and thought content normal.  Nursing note and vitals reviewed.   Ortho Exam Left knee exam shows a small joint effusion.  Collaterals  are stable.  1+ Lachman with firm endpoint.  Fully healed surgical scar. Specialty Comments:  No specialty comments available.  Imaging: Xr Knee 3 View Left  Result Date: 08/19/2017 Moderate degenerative joint disease with preserved interference screws from previous ACL reconstruction    PMFS History: Patient Active Problem List   Diagnosis Date Noted  . Pain in right finger(s) 06/29/2016  . Impingement syndrome of left shoulder 05/25/2016   Past Medical History:  Diagnosis Date  . Acid reflux   . Medial meniscus tear 05/2015   right knee    History reviewed. No pertinent family history.  Past Surgical History:  Procedure Laterality Date  . ANTERIOR CRUCIATE LIGAMENT REPAIR Bilateral   . KNEE ARTHROSCOPY WITH MEDIAL MENISECTOMY Right 06/08/2015   Procedure: RIGHT KNEE ARTHROSCOPY WITH PARTIAL MEDIAL MENISCECTOMY;  Surgeon: Tarry Kos, MD;  Location: [facility];  Service: Orthopedics;  Laterality: Right;  . KNEE ARTHROSCOPY WITH MENISCAL REPAIR Left   . ORIF CLAVICLE FRACTURE     Social History   Occupational History  . Not on file  Tobacco Use  . Smoking status: Never Smoker  . Smokeless tobacco: Never Used  Substance and Sexual Activity  . Alcohol use: Yes    Comment: occasionally  . Drug use: No  . Sexual activity: Not on file

## 2017-09-01 ENCOUNTER — Ambulatory Visit
Admission: RE | Admit: 2017-09-01 | Discharge: 2017-09-01 | Disposition: A | Discharge status: Home / Self Care | Payer: BLUE CROSS/BLUE SHIELD | Source: Ambulatory Visit | Attending: Orthopaedic Surgery | Admitting: Orthopaedic Surgery

## 2017-09-01 DIAGNOSIS — M25562 Pain in left knee: Secondary | ICD-10-CM | POA: Diagnosis not present

## 2017-09-05 ENCOUNTER — Ambulatory Visit (INDEPENDENT_AMBULATORY_CARE_PROVIDER_SITE_OTHER): Payer: BLUE CROSS/BLUE SHIELD | Admitting: Orthopaedic Surgery

## 2017-09-05 DIAGNOSIS — M25562 Pain in left knee: Secondary | ICD-10-CM | POA: Diagnosis not present

## 2017-09-05 NOTE — Progress Notes (Signed)
   Office Visit Note   Patient: Jerome Cole           Date of Birth: 02/09/1971           MRN: [id number] Visit Date: 09/05/2017              Requested by: Joycelyn RuaMeyers, Stephen, MD [address] [place], [place] [address] PCP: Joycelyn RuaMeyers, Stephen, MD   Assessment & Plan: Visit Diagnoses:  1. Acute pain of left knee     Plan: Impression left knee pain I suspect that he may have stretched his ACL graft.  He is status post 2 partial medial meniscectomy which may also be irritating him from the recent injury.  Overall his MRI findings did not show any obvious surgical problems.  I would recommend to try to treat this conservatively with a functional ACL brace and 6 weeks of physical therapy for strengthening.  We will recheck him at that time.  Follow-Up Instructions: Return in about 6 weeks (around 10/17/2017).   Orders:  No orders of the defined types were placed in this encounter.  No orders of the defined types were placed in this encounter.     Procedures: No procedures performed   Clinical Data: No additional findings.   Subjective: Chief Complaint  Patient presents with  . Left Knee - Pain    Jerome Cole follows up today for his MRI.  He states that he is slightly improved.  He still has some pain on the medial joint line.  He has some weakness with climbing stairs.  Denies any numbness and tingling.    Review of Systems   Objective: Vital Signs: There were no vitals taken for this visit.  Physical Exam  Ortho Exam Left knee exam shows no joint effusion.  Lachman test is stable with 1+ laxity.  Collaterals are stable.  Negative McMurray. Specialty Comments:  No specialty comments available.  Imaging: No results found.   PMFS History: Patient Active Problem List   Diagnosis Date Noted  . Pain in right finger(s) 06/29/2016  . Impingement syndrome of left shoulder 05/25/2016   Past Medical History:  Diagnosis Date  . Acid reflux   . Medial meniscus tear  05/2015   right knee    No family history on file.  Past Surgical History:  Procedure Laterality Date  . ANTERIOR CRUCIATE LIGAMENT REPAIR Bilateral   . KNEE ARTHROSCOPY WITH MEDIAL MENISECTOMY Right 06/08/2015   Procedure: RIGHT KNEE ARTHROSCOPY WITH PARTIAL MEDIAL MENISCECTOMY;  Surgeon: Tarry KosNaiping M Xu, MD;  Location: [facility];  Service: Orthopedics;  Laterality: Right;  . KNEE ARTHROSCOPY WITH MENISCAL REPAIR Left   . ORIF CLAVICLE FRACTURE     Social History   Occupational History  . Not on file  Tobacco Use  . Smoking status: Never Smoker  . Smokeless tobacco: Never Used  Substance and Sexual Activity  . Alcohol use: Yes    Comment: occasionally  . Drug use: No  . Sexual activity: Not on file

## 2017-09-10 DIAGNOSIS — S83242D Other tear of medial meniscus, current injury, left knee, subsequent encounter: Secondary | ICD-10-CM | POA: Diagnosis not present

## 2017-09-10 DIAGNOSIS — S83512D Sprain of anterior cruciate ligament of left knee, subsequent encounter: Secondary | ICD-10-CM | POA: Diagnosis not present

## 2017-09-13 DIAGNOSIS — S83512D Sprain of anterior cruciate ligament of left knee, subsequent encounter: Secondary | ICD-10-CM | POA: Diagnosis not present

## 2017-09-14 DIAGNOSIS — S83242D Other tear of medial meniscus, current injury, left knee, subsequent encounter: Secondary | ICD-10-CM | POA: Diagnosis not present

## 2017-09-14 DIAGNOSIS — S83512D Sprain of anterior cruciate ligament of left knee, subsequent encounter: Secondary | ICD-10-CM | POA: Diagnosis not present

## 2017-09-16 DIAGNOSIS — S83242D Other tear of medial meniscus, current injury, left knee, subsequent encounter: Secondary | ICD-10-CM | POA: Diagnosis not present

## 2017-09-16 DIAGNOSIS — S83512D Sprain of anterior cruciate ligament of left knee, subsequent encounter: Secondary | ICD-10-CM | POA: Diagnosis not present

## 2017-09-21 DIAGNOSIS — S83512D Sprain of anterior cruciate ligament of left knee, subsequent encounter: Secondary | ICD-10-CM | POA: Diagnosis not present

## 2017-09-21 DIAGNOSIS — S83242D Other tear of medial meniscus, current injury, left knee, subsequent encounter: Secondary | ICD-10-CM | POA: Diagnosis not present

## 2017-09-23 DIAGNOSIS — S83512D Sprain of anterior cruciate ligament of left knee, subsequent encounter: Secondary | ICD-10-CM | POA: Diagnosis not present

## 2017-09-23 DIAGNOSIS — S83242D Other tear of medial meniscus, current injury, left knee, subsequent encounter: Secondary | ICD-10-CM | POA: Diagnosis not present

## 2017-09-27 DIAGNOSIS — S83242D Other tear of medial meniscus, current injury, left knee, subsequent encounter: Secondary | ICD-10-CM | POA: Diagnosis not present

## 2017-09-27 DIAGNOSIS — S83512D Sprain of anterior cruciate ligament of left knee, subsequent encounter: Secondary | ICD-10-CM | POA: Diagnosis not present

## 2017-09-28 DIAGNOSIS — S83242D Other tear of medial meniscus, current injury, left knee, subsequent encounter: Secondary | ICD-10-CM | POA: Diagnosis not present

## 2017-09-28 DIAGNOSIS — S83512D Sprain of anterior cruciate ligament of left knee, subsequent encounter: Secondary | ICD-10-CM | POA: Diagnosis not present

## 2017-10-04 DIAGNOSIS — S83512D Sprain of anterior cruciate ligament of left knee, subsequent encounter: Secondary | ICD-10-CM | POA: Diagnosis not present

## 2017-10-04 DIAGNOSIS — S83242D Other tear of medial meniscus, current injury, left knee, subsequent encounter: Secondary | ICD-10-CM | POA: Diagnosis not present

## 2017-10-07 DIAGNOSIS — S83242D Other tear of medial meniscus, current injury, left knee, subsequent encounter: Secondary | ICD-10-CM | POA: Diagnosis not present

## 2017-10-07 DIAGNOSIS — S83512D Sprain of anterior cruciate ligament of left knee, subsequent encounter: Secondary | ICD-10-CM | POA: Diagnosis not present

## 2017-10-18 ENCOUNTER — Ambulatory Visit (INDEPENDENT_AMBULATORY_CARE_PROVIDER_SITE_OTHER): Payer: BLUE CROSS/BLUE SHIELD | Admitting: Orthopaedic Surgery

## 2017-10-18 ENCOUNTER — Encounter (INDEPENDENT_AMBULATORY_CARE_PROVIDER_SITE_OTHER): Payer: Self-pay | Admitting: Orthopaedic Surgery

## 2017-10-18 DIAGNOSIS — M25562 Pain in left knee: Secondary | ICD-10-CM | POA: Diagnosis not present

## 2017-10-18 DIAGNOSIS — S83242D Other tear of medial meniscus, current injury, left knee, subsequent encounter: Secondary | ICD-10-CM | POA: Diagnosis not present

## 2017-10-18 DIAGNOSIS — S83512D Sprain of anterior cruciate ligament of left knee, subsequent encounter: Secondary | ICD-10-CM | POA: Diagnosis not present

## 2017-10-18 NOTE — Progress Notes (Signed)
   Office Visit Note   Patient: Jerome HatchDave Wrightson           Date of Birth: 02/01/1971           MRN: [id number] Visit Date: 10/18/2017              Requested by: Joycelyn RuaMeyers, Stephen, MD [address] [place], [place] [address] PCP: Joycelyn RuaMeyers, Stephen, MD   Assessment & Plan: Visit Diagnoses:  1. Acute pain of left knee     Plan: At this point, we will have the patient continue working in outpatient physical therapy to regain as much strength as possible.  He will wear his ACL brace with activity.  He will follow-up with us in 6 weeks time for repeat evaluation.  Call if concerns or questions in the meantime.  Follow-Up Instructions: Return in about 6 weeks (around 11/29/2017).   Orders:  No orders of the defined types were placed in this encounter.  No orders of the defined types were placed in this encounter.     Procedures: No procedures performed   Clinical Data: No additional findings.   Subjective: Chief Complaint  Patient presents with  . Left Knee - Pain, Follow-up    HPI patient comes in for follow-up.  He is approximately 8 weeks out from acute injury and pain to the left knee.  MRI was obtained which showed a stretched ACL graft, but nothing that requires surgical intervention.  He was given an ACL brace for activity.  He has been in outpatient physical therapy making slow but steady progress.  He feels that he is regaining some strength but is still very hesitant with activities.   Review of Systems as detailed in HPI.  All others are negative.   Objective: Vital Signs: There were no vitals taken for this visit.  Physical Exam well-developed well-nourished gentleman in no acute distress.  Alert and oriented x3.  Ortho Exam examination of the left knee shows no effusion.  Range of motion 0 to 130 degrees.  Negative anterior drawer.  5 out of 5 strength with resisted straight leg raise.  Specialty Comments:  No specialty comments available.  Imaging: No new  imaging today.   PMFS History: Patient Active Problem List   Diagnosis Date Noted  . Acute pain of left knee 10/18/2017  . Pain in right finger(s) 06/29/2016  . Impingement syndrome of left shoulder 05/25/2016   Past Medical History:  Diagnosis Date  . Acid reflux   . Medial meniscus tear 05/2015   right knee    History reviewed. No pertinent family history.  Past Surgical History:  Procedure Laterality Date  . ANTERIOR CRUCIATE LIGAMENT REPAIR Bilateral   . KNEE ARTHROSCOPY WITH MEDIAL MENISECTOMY Right 06/08/2015   Procedure: RIGHT KNEE ARTHROSCOPY WITH PARTIAL MEDIAL MENISCECTOMY;  Surgeon: Tarry KosNaiping M Xu, MD;  Location: [facility];  Service: Orthopedics;  Laterality: Right;  . KNEE ARTHROSCOPY WITH MENISCAL REPAIR Left   . ORIF CLAVICLE FRACTURE     Social History   Occupational History  . Not on file  Tobacco Use  . Smoking status: Never Smoker  . Smokeless tobacco: Never Used  Substance and Sexual Activity  . Alcohol use: Yes    Comment: occasionally  . Drug use: No  . Sexual activity: Not on file

## 2017-10-21 DIAGNOSIS — F419 Anxiety disorder, unspecified: Secondary | ICD-10-CM | POA: Diagnosis not present

## 2017-10-21 DIAGNOSIS — S83512D Sprain of anterior cruciate ligament of left knee, subsequent encounter: Secondary | ICD-10-CM | POA: Diagnosis not present

## 2017-10-21 DIAGNOSIS — S83242D Other tear of medial meniscus, current injury, left knee, subsequent encounter: Secondary | ICD-10-CM | POA: Diagnosis not present

## 2017-10-26 DIAGNOSIS — S83242D Other tear of medial meniscus, current injury, left knee, subsequent encounter: Secondary | ICD-10-CM | POA: Diagnosis not present

## 2017-10-26 DIAGNOSIS — S83512D Sprain of anterior cruciate ligament of left knee, subsequent encounter: Secondary | ICD-10-CM | POA: Diagnosis not present

## 2017-10-28 DIAGNOSIS — S83242D Other tear of medial meniscus, current injury, left knee, subsequent encounter: Secondary | ICD-10-CM | POA: Diagnosis not present

## 2017-10-28 DIAGNOSIS — S83512D Sprain of anterior cruciate ligament of left knee, subsequent encounter: Secondary | ICD-10-CM | POA: Diagnosis not present

## 2017-11-01 DIAGNOSIS — S83512D Sprain of anterior cruciate ligament of left knee, subsequent encounter: Secondary | ICD-10-CM | POA: Diagnosis not present

## 2017-11-01 DIAGNOSIS — S83242D Other tear of medial meniscus, current injury, left knee, subsequent encounter: Secondary | ICD-10-CM | POA: Diagnosis not present

## 2017-11-11 DIAGNOSIS — S83242D Other tear of medial meniscus, current injury, left knee, subsequent encounter: Secondary | ICD-10-CM | POA: Diagnosis not present

## 2017-11-11 DIAGNOSIS — S83512D Sprain of anterior cruciate ligament of left knee, subsequent encounter: Secondary | ICD-10-CM | POA: Diagnosis not present

## 2017-11-15 DIAGNOSIS — S83512D Sprain of anterior cruciate ligament of left knee, subsequent encounter: Secondary | ICD-10-CM | POA: Diagnosis not present

## 2017-11-15 DIAGNOSIS — S83242D Other tear of medial meniscus, current injury, left knee, subsequent encounter: Secondary | ICD-10-CM | POA: Diagnosis not present

## 2017-11-21 DIAGNOSIS — S83242D Other tear of medial meniscus, current injury, left knee, subsequent encounter: Secondary | ICD-10-CM | POA: Diagnosis not present

## 2017-11-21 DIAGNOSIS — S83512D Sprain of anterior cruciate ligament of left knee, subsequent encounter: Secondary | ICD-10-CM | POA: Diagnosis not present

## 2017-11-22 DIAGNOSIS — F411 Generalized anxiety disorder: Secondary | ICD-10-CM | POA: Diagnosis not present

## 2017-11-25 DIAGNOSIS — S83242D Other tear of medial meniscus, current injury, left knee, subsequent encounter: Secondary | ICD-10-CM | POA: Diagnosis not present

## 2017-11-25 DIAGNOSIS — S83512D Sprain of anterior cruciate ligament of left knee, subsequent encounter: Secondary | ICD-10-CM | POA: Diagnosis not present

## 2017-11-29 ENCOUNTER — Ambulatory Visit (INDEPENDENT_AMBULATORY_CARE_PROVIDER_SITE_OTHER): Payer: BLUE CROSS/BLUE SHIELD | Admitting: Orthopaedic Surgery

## 2017-12-05 ENCOUNTER — Encounter (INDEPENDENT_AMBULATORY_CARE_PROVIDER_SITE_OTHER): Payer: Self-pay | Admitting: Orthopaedic Surgery

## 2017-12-05 ENCOUNTER — Ambulatory Visit (INDEPENDENT_AMBULATORY_CARE_PROVIDER_SITE_OTHER): Payer: BLUE CROSS/BLUE SHIELD | Admitting: Orthopaedic Surgery

## 2017-12-05 DIAGNOSIS — M25562 Pain in left knee: Secondary | ICD-10-CM | POA: Diagnosis not present

## 2017-12-05 NOTE — Progress Notes (Signed)
Office Visit Note   Patient: Jerome Cole           Date of Birth: 11/21/1970           MRN: [id number] Visit Date: 12/05/2017              Requested by: Joycelyn Rua, MD [address] [place], [place] [address] PCP: Joycelyn Rua, MD   Assessment & Plan: Visit Diagnoses:  1. Acute pain of left knee     Plan: Impression is left knee stretched vs torn ACL graft with noticeable instability.  He has had two ACL reconstructions already.  This point, the patient would like to give this another 3 months to regain strength to see if that will help with the instability.  He will follow-up with [place] at the end of August for repeat evaluation.  If he is still noticing moderate instability we may entertain revision ACL reconstruction.  He's not having any instability with just ADLs so he can live with it for now. Total face to face encounter time was greater than 25 minutes and over half of this time was spent in counseling and/or coordination of care.  Follow-Up Instructions: Return in about 3 months (around 03/07/2018).   Orders:  No orders of the defined types were placed in this encounter.  No orders of the defined types were placed in this encounter.     Procedures: No procedures performed   Clinical Data: No additional findings.   Subjective: Chief Complaint  Patient presents with  . Left Knee - Pain    HPI patient comes in for follow-up for the left knee.  He had an injury back in February of this past year when he jumped and when he came down his left knee buckled.  He had a bloody effusion which yielded 15 cc of fluid.  He has been injected with cortisone as well as been to outpatient physical therapy to work on range of motion and strengthening.  He is status post bilateral knee ACL reconstructions with the left approximately 8 years ago with an allograft in the right approximately 30 years with a patella tendon autograft.  Recent MRI showed questionable stretching  of the ACL graft on the left.  The patient is regained a fair amount of strength and is now pain-free, however notes moderate instability with activity.  Review of Systems as detailed in HPI.  All others reviewed and are negative.   Objective: Vital Signs: There were no vitals taken for this visit.  Physical Exam well-developed well-nourished gentleman no acute distress.  Alert and oriented x3.  Ortho Exam examination of the left knee shows no effusion.  Range of motion 0 to 125 degrees.  There is somewhat more laxity on the left knee compared to the right with Lachman exam.  He is stable to valgus and varus stress.  Specialty Comments:  No specialty comments available.  Imaging: No new imaging today   PMFS History: Patient Active Problem List   Diagnosis Date Noted  . Acute pain of left knee 10/18/2017  . Pain in right finger(s) 06/29/2016  . Impingement syndrome of left shoulder 05/25/2016   Past Medical History:  Diagnosis Date  . Acid reflux   . Medial meniscus tear 05/2015   right knee    History reviewed. No pertinent family history.  Past Surgical History:  Procedure Laterality Date  . ANTERIOR CRUCIATE LIGAMENT REPAIR Bilateral   . KNEE ARTHROSCOPY WITH MEDIAL MENISECTOMY Right 06/08/2015  Procedure: RIGHT KNEE ARTHROSCOPY WITH PARTIAL MEDIAL MENISCECTOMY;  Surgeon: Tarry Kos, MD;  Location: [facility];  Service: Orthopedics;  Laterality: Right;  . KNEE ARTHROSCOPY WITH MENISCAL REPAIR Left   . ORIF CLAVICLE FRACTURE     Social History   Occupational History  . Not on file  Tobacco Use  . Smoking status: Never Smoker  . Smokeless tobacco: Never Used  Substance and Sexual Activity  . Alcohol use: Yes    Comment: occasionally  . Drug use: No  . Sexual activity: Not on file

## 2017-12-23 DIAGNOSIS — F411 Generalized anxiety disorder: Secondary | ICD-10-CM | POA: Diagnosis not present

## 2017-12-25 DIAGNOSIS — F411 Generalized anxiety disorder: Secondary | ICD-10-CM | POA: Diagnosis not present

## 2018-01-09 DIAGNOSIS — F411 Generalized anxiety disorder: Secondary | ICD-10-CM | POA: Diagnosis not present

## 2018-01-17 DIAGNOSIS — Z3009 Encounter for other general counseling and advice on contraception: Secondary | ICD-10-CM | POA: Diagnosis not present

## 2018-01-20 DIAGNOSIS — F411 Generalized anxiety disorder: Secondary | ICD-10-CM | POA: Diagnosis not present

## 2018-02-03 DIAGNOSIS — F411 Generalized anxiety disorder: Secondary | ICD-10-CM | POA: Diagnosis not present

## 2018-02-17 DIAGNOSIS — F411 Generalized anxiety disorder: Secondary | ICD-10-CM | POA: Diagnosis not present

## 2018-03-03 DIAGNOSIS — F411 Generalized anxiety disorder: Secondary | ICD-10-CM | POA: Diagnosis not present

## 2018-03-07 ENCOUNTER — Ambulatory Visit (INDEPENDENT_AMBULATORY_CARE_PROVIDER_SITE_OTHER): Payer: Self-pay

## 2018-03-07 ENCOUNTER — Ambulatory Visit (INDEPENDENT_AMBULATORY_CARE_PROVIDER_SITE_OTHER): Payer: BLUE CROSS/BLUE SHIELD | Admitting: Orthopaedic Surgery

## 2018-03-07 ENCOUNTER — Encounter (INDEPENDENT_AMBULATORY_CARE_PROVIDER_SITE_OTHER): Payer: Self-pay | Admitting: Orthopaedic Surgery

## 2018-03-07 DIAGNOSIS — M79644 Pain in right finger(s): Secondary | ICD-10-CM

## 2018-03-07 DIAGNOSIS — G8929 Other chronic pain: Secondary | ICD-10-CM

## 2018-03-07 DIAGNOSIS — M25562 Pain in left knee: Secondary | ICD-10-CM | POA: Diagnosis not present

## 2018-03-07 DIAGNOSIS — F411 Generalized anxiety disorder: Secondary | ICD-10-CM | POA: Diagnosis not present

## 2018-03-07 NOTE — Progress Notes (Signed)
Office Visit Note   Patient: Jerome Cole           Date of Birth: 12/13/1970           MRN: [id number] Visit Date: 03/07/2018              Requested by: Joycelyn Rua, MD [address] [place], [place] [address] PCP: Joycelyn Rua, MD   Assessment & Plan: Visit Diagnoses:  1. Chronic pain of left knee   2. Pain of right thumb     Plan: Overall Jerome Cole is doing well from the standpoint of his left knee.  He wears a functional ACL brace when he is really active.  I agree that at this point I do not think is worth undergoing a third operation to reconstruct his ACL.  In regards to his right thumb I think he has this to UCL sprain possible partial tear but there is no instability.  I offered him hand therapy and a Thermoplast splint which he declined.  I would like to recheck his thumb in about 4 weeks.  Follow-Up Instructions: Return in about 4 weeks (around 04/04/2018).   Orders:  Orders Placed This Encounter  Procedures  . XR Finger Thumb Right   No orders of the defined types were placed in this encounter.     Procedures: No procedures performed   Clinical Data: No additional findings.   Subjective: Chief Complaint  Patient presents with  . Left Knee - Pain    Jerome Cole follows up today for his left knee pain.  Overall he is doing well.  He definitely noticed some instability but he is fine with living with this.  He recently ran a 5K and did not have any swelling afterwards.  Just some soreness.  He has a new injury to his right thumb.  He was doing [employer] when his thumb was caught on the ground and was forcefully radially deviated.  This happened 2 weeks ago.  His pain has improved but he still has some swelling.  Pain is worse with grasping and pinching.   Review of Systems  Constitutional: Negative.   All other systems reviewed and are negative.    Objective: Vital Signs: There were no vitals taken for this visit.  Physical Exam  Constitutional: He  is oriented to person, place, and time. He appears well-developed and well-nourished.  HENT:  Head: Normocephalic and atraumatic.  Eyes: Pupils are equal, round, and reactive to light.  Neck: Neck supple.  Pulmonary/Chest: Effort normal.  Abdominal: Soft.  Musculoskeletal: Normal range of motion.  Neurological: He is alert and oriented to person, place, and time.  Skin: Skin is warm.  Psychiatric: He has a normal mood and affect. His behavior is normal. Judgment and thought content normal.  Nursing note and vitals reviewed.   Ortho Exam Left knee exam is stable. Right thumb exam shows swelling of the MCP joint.  Testing of the accessory and proper ulnar collateral ligaments is stable and symmetric.  He does have some decreased flexion of the thumb. Specialty Comments:  No specialty comments available.  Imaging: Xr Finger Thumb Right  Result Date: 03/07/2018 No acute or structural abnormalities.  Congruent MCP joint.    PMFS History: Patient Active Problem List   Diagnosis Date Noted  . Acute pain of left knee 10/18/2017  . Pain in right finger(s) 06/29/2016  . Impingement syndrome of left shoulder 05/25/2016   Past Medical History:  Diagnosis Date  . Acid  reflux   . Medial meniscus tear 05/2015   right knee    History reviewed. No pertinent family history.  Past Surgical History:  Procedure Laterality Date  . ANTERIOR CRUCIATE LIGAMENT REPAIR Bilateral   . KNEE ARTHROSCOPY WITH MEDIAL MENISECTOMY Right 06/08/2015   Procedure: RIGHT KNEE ARTHROSCOPY WITH PARTIAL MEDIAL MENISCECTOMY;  Surgeon: Tarry KosNaiping M Xu, MD;  Location: [facility];  Service: Orthopedics;  Laterality: Right;  . KNEE ARTHROSCOPY WITH MENISCAL REPAIR Left   . ORIF CLAVICLE FRACTURE     Social History   Occupational History  . Not on file  Tobacco Use  . Smoking status: Never Smoker  . Smokeless tobacco: Never Used  Substance and Sexual Activity  . Alcohol use: Yes    Comment:  occasionally  . Drug use: No  . Sexual activity: Not on file

## 2018-03-20 DIAGNOSIS — F411 Generalized anxiety disorder: Secondary | ICD-10-CM | POA: Diagnosis not present

## 2018-03-21 DIAGNOSIS — Z302 Encounter for sterilization: Secondary | ICD-10-CM | POA: Diagnosis not present

## 2018-04-04 ENCOUNTER — Ambulatory Visit (INDEPENDENT_AMBULATORY_CARE_PROVIDER_SITE_OTHER): Payer: BLUE CROSS/BLUE SHIELD | Admitting: Orthopaedic Surgery

## 2018-04-04 ENCOUNTER — Encounter (INDEPENDENT_AMBULATORY_CARE_PROVIDER_SITE_OTHER): Payer: Self-pay | Admitting: Orthopaedic Surgery

## 2018-04-04 DIAGNOSIS — M25562 Pain in left knee: Secondary | ICD-10-CM

## 2018-04-04 DIAGNOSIS — G8929 Other chronic pain: Secondary | ICD-10-CM

## 2018-04-04 DIAGNOSIS — M79644 Pain in right finger(s): Secondary | ICD-10-CM | POA: Diagnosis not present

## 2018-04-04 NOTE — Progress Notes (Signed)
   Office Visit Note   Patient: Jerome Cole           Date of Birth: 06/19/1971           MRN: [id number] Visit Date: 04/04/2018              Requested by: Joycelyn RuaMeyers, Stephen, MD [address] [place], [place] [address] PCP: Joycelyn RuaMeyers, Stephen, MD   Assessment & Plan: Visit Diagnoses:  1. Pain of right thumb   2. Chronic pain of left knee     Plan: Overall Jerome Cole is improving.  Reassurance was provided that the thumb can be sore for several months or even a year.  He is not unstable.  I did offer him a stabilizing brace which he declined.  He will use his ACL brace for his left knee when he is active.  Otherwise I can see him back as needed.  I did give him a sample of pen said for his thumb.  Follow-Up Instructions: Return if symptoms worsen or fail to improve.   Orders:  No orders of the defined types were placed in this encounter.  No orders of the defined types were placed in this encounter.     Procedures: No procedures performed   Clinical Data: No additional findings.   Subjective: Chief Complaint  Patient presents with  . Left Knee - Pain    Jerome Cole follows up for his right thumb ulnar collateral ligament sprain and left knee pain.  Overall he is doing well and he is improving.  He does have some discomfort when he is pinching using his right hand.  Left knee feels a little unstable but he does have a functional ACL brace.   Review of Systems   Objective: Vital Signs: There were no vitals taken for this visit.  Physical Exam  Ortho Exam Right thumb exam shows a stable ulnar collateral ligament.  He does have stiffness with thumb flexion. Left knee exam is stable. Specialty Comments:  No specialty comments available.  Imaging: No results found.   PMFS History: Patient Active Problem List   Diagnosis Date Noted  . Acute pain of left knee 10/18/2017  . Pain in right finger(s) 06/29/2016  . Impingement syndrome of left shoulder 05/25/2016   Past  Medical History:  Diagnosis Date  . Acid reflux   . Medial meniscus tear 05/2015   right knee    History reviewed. No pertinent family history.  Past Surgical History:  Procedure Laterality Date  . ANTERIOR CRUCIATE LIGAMENT REPAIR Bilateral   . KNEE ARTHROSCOPY WITH MEDIAL MENISECTOMY Right 06/08/2015   Procedure: RIGHT KNEE ARTHROSCOPY WITH PARTIAL MEDIAL MENISCECTOMY;  Surgeon: Tarry KosNaiping M Traye Bates, MD;  Location: [facility];  Service: Orthopedics;  Laterality: Right;  . KNEE ARTHROSCOPY WITH MENISCAL REPAIR Left   . ORIF CLAVICLE FRACTURE     Social History   Occupational History  . Not on file  Tobacco Use  . Smoking status: Never Smoker  . Smokeless tobacco: Never Used  Substance and Sexual Activity  . Alcohol use: Yes    Comment: occasionally  . Drug use: No  . Sexual activity: Not on file

## 2018-04-07 DIAGNOSIS — F411 Generalized anxiety disorder: Secondary | ICD-10-CM | POA: Diagnosis not present

## 2018-04-11 ENCOUNTER — Encounter: Payer: Self-pay | Admitting: *Deleted

## 2018-04-18 ENCOUNTER — Encounter: Payer: Self-pay | Admitting: *Deleted

## 2018-04-18 DIAGNOSIS — F332 Major depressive disorder, recurrent severe without psychotic features: Secondary | ICD-10-CM

## 2018-04-28 ENCOUNTER — Ambulatory Visit: Payer: Self-pay | Admitting: Psychiatry

## 2018-05-05 ENCOUNTER — Ambulatory Visit: Payer: BLUE CROSS/BLUE SHIELD | Admitting: Mental Health

## 2018-05-05 DIAGNOSIS — F411 Generalized anxiety disorder: Secondary | ICD-10-CM

## 2018-05-05 NOTE — Progress Notes (Signed)
      [facility] Counselor/Therapist Progress Note   Patient ID: Jerome Cole, MRN: [id number]  Date: 05/05/2018  Timespent: 57 mintues  Treatment Type: Individual  Subjective: Patient arrived on time for session. He stated he and his wife went to couples counseling; he stated his wife made a point to discuss concerns she had following his surgery.  Patient stated that he was able to assert himself and share his view on the situation, how he was recovering from surgery with her having some unrealistic expectations about how he was able to help out with the children.  Patient stated that he often feels her expectations and desires are not discussed as a couple.  He states she will often make decisions and then he tends to react irritability at times, when sharing his viewpoint.  Provided support and continue to assist patient and some ways to communicate effectively.  Encouraged him to continue to take steps toward self care.   Interventions:CBT, Solution Focused and Strength-based  Mental Status Exam:   Appearance:   Casual     Behavior:  Appropriate and Sharing  Motor:  Normal  Speech/Language:   Clear and Coherent  Affect:  full range, anxious  Mood:  congruent  Thought process:  Coherent and Relevant  Thought content:    Logical  Perceptual disturbances:    Normal  Orientation:  Full (Time, Place, and Person)  Attention:  Good  Concentration:  good  Memory:  Immediate  Fund of knowledge:   Good  Insight:    Good  Judgment:   Good  Impulse Control:  good    Reported Symptoms: anxious most days, heart races, rumination, irritability  Risk Assessment: Danger to Self:  No Self-injurious Behavior: No Danger to Others: No Duty to Warn:no Physical Aggression / Violence:No  Access to Firearms a concern: No  Gang Involvement:No   Diagnosis:   ICD-10-CM   1. Generalized anxiety disorder F41.1      Plan:   1.  Patient to continue to engage in individual counseling 2-4  times a month or as needed. 2.  Patient to identify and apply CBT, coping skills learned in session to decrease depression and anxiety symptoms. 3.  Patient to contact this office, go to the local ED or call 911 if a crisis or emergency develops between visits.  [name], [facility]

## 2018-05-09 ENCOUNTER — Ambulatory Visit: Payer: BLUE CROSS/BLUE SHIELD | Admitting: Psychiatry

## 2018-05-09 DIAGNOSIS — F0630 Mood disorder due to known physiological condition, unspecified: Secondary | ICD-10-CM | POA: Diagnosis not present

## 2018-05-09 DIAGNOSIS — F411 Generalized anxiety disorder: Secondary | ICD-10-CM

## 2018-05-09 MED ORDER — ESCITALOPRAM OXALATE 10 MG PO TABS: 10.0000 mg | ORAL_TABLET | Freq: Every day | ORAL | 1 refills | Status: DC

## 2018-05-09 NOTE — Progress Notes (Signed)
[facility] Med Check  Patient ID: Jerome Cole,  MRN: [id number]  PCP: Joycelyn Rua, MD  Date of Evaluation: 05/09/2018 Time spent:20 minutes   HISTORY/CURRENT STATUS: HPI patient is a 47 year old white male last seen 03/03/2018.  He carries a diagnosis of anxiety and mood disorder. Patient continues to do well.  Does have side effects of decreased orgasm on Lexapro.  He and his wife are going to couples counseling and patient is traveling less. Patient is also seeing Elio Forget in our office.  individual Medical History/ Review of Systems: Changes? :No  Allergies: Patient has no known allergies.  Current Medications:  Current Outpatient Medications:  .  escitalopram (LEXAPRO) 10 MG tablet, Take 10 mg by mouth daily., Disp: , Rfl:  .  ibuprofen (ADVIL,MOTRIN) 200 MG tablet, Take 200 mg by mouth every 6 (six) hours as needed., Disp: , Rfl:  .  omeprazole (PRILOSEC) 20 MG capsule, Take 20 mg by mouth daily., Disp: , Rfl:  .  HYDROcodone-acetaminophen (NORCO) 5-325 MG tablet, Take 1-2 tablets by mouth every 6 (six) hours as needed. (Patient not taking: Reported on 06/29/2016), Disp: 90 tablet, Rfl: 0 Medication Side Effects: Other: decrease orgasm  Family Medical/ Social History: Changes? No  MENTAL HEALTH EXAM:  There were no vitals taken for this visit.There is no height or weight on file to calculate BMI.  General Appearance: Casual  Eye Contact:  Good  Speech:  Normal Rate  Volume:  Normal  Mood:  Euthymic  Affect:  appropriate  Thought Process:  Linear  Orientation:  Full (Time, Place, and Person)  Thought Content: Logical   Suicidal Thoughts:  No  Homicidal Thoughts:  No  Memory:  normal  Judgement:  Good  Insight:  Good  Psychomotor Activity:  Normal  Concentration:  Concentration: Good  Recall:  Good  Fund of Knowledge: Good  Language: Good  Akathisia:  NA  AIMS (if indicated:  na  Assets:  Desire for Improvement  ADL's:  Intact  Cognition: WNL   Prognosis:  Good    DIAGNOSES:    ICD-10-CM   1. Anxiety state F41.1   2. Mood disorder in conditions classified elsewhere F06.30      RECOMMENDATIONS: Continue Lexapro 10 mg a day.  We will not change it to Trintellix at this time this patient is doing well.  Change would be for decrease orgasm. Return in 1 month    [address], [place]

## 2018-05-26 ENCOUNTER — Ambulatory Visit: Payer: BLUE CROSS/BLUE SHIELD | Admitting: Mental Health

## 2018-05-26 DIAGNOSIS — F411 Generalized anxiety disorder: Secondary | ICD-10-CM

## 2018-05-26 NOTE — Progress Notes (Signed)
      [facility] Counselor/Therapist Progress Note   Patient ID: Jerome Cole, MRN: [id number]  Date: 05/26/2018  Timespent: 61 mintues  Treatment Type: Individual  Mental Status Exam:   Appearance:   Casual     Behavior:  Appropriate and Sharing  Motor:  Normal  Speech/Language:   Clear and Coherent  Affect:  full range, anxious  Mood:  congruent  Thought process:  Coherent and Relevant  Thought content:    Logical  Perceptual disturbances:    Normal  Orientation:  Full (Time, Place, and Person)  Attention:  Good  Concentration:  good  Memory:  Immediate  Fund of knowledge:   Good  Insight:    Good  Judgment:   Good  Impulse Control:  good    Reported Symptoms: anxious most days, heart races, rumination, irritability  Risk Assessment: Danger to Self:  No Self-injurious Behavior: No Danger to Others: No Duty to Warn:no Physical Aggression / Violence:No  Access to Firearms a concern: No  Gang Involvement:No   Patient / guardian was educated about steps to take if suicide or homicide risk level increases between visits. While future psychiatric events cannot be accurately predicted, the patient does not currently require acute inpatient psychiatric care and does not currently meet [facility] involuntary commitment criteria.  Subjective: Patient arrived on time for session. He stated he has been working on a home project for his daughter and his parents are in town.  He stated that he plans to have his father help him, going on to share some content related to their relationship.  He shared how he and his wife are doing better in their relationship, communicating more effectively.  He identified he needs in the relationship related to being more validated and affirmed by his wife for his efforts.  Patient often is highly engaged at home, involved with the children's activities as well as cooking and cleaning.  He stated that he made changes to his work schedule where he  would be home more often as this was identified by his wife at one point as a need.  He shared some content of they are going to marital counseling.  Patient stated he has been able to be more assertive and stating his needs and wants.  Encouraged him to share his identified needs the relationship with his wife as this plays a role in his anxiety and irritability at times.  Interventions:CBT, Solution Focused and Strength-based  Diagnosis:   ICD-10-CM   1. Generalized anxiety disorder F41.1     Plan:   1.  Patient to continue to engage in individual counseling 2-4 times a month or as needed. 2.  Patient to identify and apply CBT, coping skills learned in session to decrease depression and anxiety symptoms. 3.  Patient to contact this office, go to the local ED or call 911 if a crisis or emergency develops between visits.  [name], [facility]

## 2018-06-06 ENCOUNTER — Ambulatory Visit: Payer: BLUE CROSS/BLUE SHIELD | Admitting: Psychiatry

## 2018-06-06 DIAGNOSIS — F411 Generalized anxiety disorder: Secondary | ICD-10-CM

## 2018-06-06 DIAGNOSIS — F39 Unspecified mood [affective] disorder: Secondary | ICD-10-CM | POA: Diagnosis not present

## 2018-06-06 MED ORDER — ESCITALOPRAM OXALATE 10 MG PO TABS
10.0000 mg | ORAL_TABLET | Freq: Every day | ORAL | 1 refills | Status: DC
Start: 2018-06-06 — End: 2018-07-18

## 2018-06-06 NOTE — Progress Notes (Signed)
[facility] Med Check  Patient ID: Jerome HatchDave Skare,  MRN: [id number]  PCP: Joycelyn RuaMeyers, Stephen, MD  Date of Evaluation: 06/06/2018 Time spent:20 minutes  Chief Complaint:   HISTORY/CURRENT STATUS: HPI patient seen 05/09/2018.  Carries diagnosis of anxiety and mood disorder.  His last visit he was doing well so no changes. Continues to do well. Busy at work. 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 Mood diary with normal moods without swings  Individual Medical History/ Review of Systems: Changes? :No   Allergies: Patient has no known allergies.  Current Medications:  Current Outpatient Medications:  .  escitalopram (LEXAPRO) 10 MG tablet, Take 1 tablet (10 mg total) by mouth daily., Disp: 30 tablet, Rfl: 1 .  ibuprofen (ADVIL,MOTRIN) 200 MG tablet, Take 200 mg by mouth every 6 (six) hours as needed., Disp: , Rfl:  .  omeprazole (PRILOSEC) 20 MG capsule, Take 20 mg by mouth daily., Disp: , Rfl:  .  HYDROcodone-acetaminophen (NORCO) 5-325 MG tablet, Take 1-2 tablets by mouth every 6 (six) hours as needed. (Patient not taking: Reported on 06/29/2016), Disp: 90 tablet, Rfl: 0 Medication Side Effects: none  Family Medical/ Social History: Changes? no MENTAL HEALTH EXAM:  There were no vitals taken for this visit.There is no height or weight on file to calculate BMI.  General Appearance: Casual  Eye Contact:  Good  Speech:  Clear and Coherent  Volume:  Normal  Mood:  Euthymic  Affect:  Appropriate  Thought Process:  Goal Directed  Orientation:  Full (Time, Place, and Person)  Thought Content: Logical   Suicidal Thoughts:  No  Homicidal Thoughts:  No  Memory:  WNL  Judgement:  Good  Insight:  Good  Psychomotor Activity:  Normal  Concentration:  Concentration: Good  Recall:  Good  Fund of Knowledge: Good  Language: Good  Assets:  Social Support  ADL's:  Intact  Cognition: WNL  Prognosis:  Good    DIAGNOSES:    ICD-10-CM   1. Episodic mood disorder (HCC) F39   2. Anxiety state F41.1      Receiving Psychotherapy: Yes  chris andrews    RECOMMENDATIONS: No change.  Continue Lexapro 10 mg a day.  Continue to follow with mood.  Recheck 6 weeks.   Anne Fulay Megen Madewell, PA-C

## 2018-06-09 ENCOUNTER — Ambulatory Visit: Payer: BLUE CROSS/BLUE SHIELD | Admitting: Mental Health

## 2018-06-09 DIAGNOSIS — F411 Generalized anxiety disorder: Secondary | ICD-10-CM | POA: Diagnosis not present

## 2018-06-09 NOTE — Progress Notes (Signed)
      [facility] Counselor/Therapist Progress Note   Patient ID: Jerome HatchDave Purpura, MRN: [id number]  Date: 06/09/2018  Timespent: 61 mintues  Treatment Type: Individual  Mental Status Exam:   Appearance:   Casual     Behavior:  Appropriate and Sharing  Motor:  Normal  Speech/Language:   Clear and Coherent  Affect:  full range, anxious  Mood:  congruent  Thought process:  Coherent and Relevant  Thought content:    Logical  Perceptual disturbances:    Normal  Orientation:  Full (Time, Place, and Person)  Attention:  Good  Concentration:  good  Memory:  Immediate  Fund of knowledge:   Good  Insight:    Good  Judgment:   Good  Impulse Control:  good    Reported Symptoms: anxious most days, heart races, rumination, irritability  Risk Assessment: Danger to Self:  No Self-injurious Behavior: No Danger to Others: No Duty to Warn:no Physical Aggression / Violence:No  Access to Firearms a concern: No  Gang Involvement:No   Patient / guardian was educated about steps to take if suicide or homicide risk level increases between visits. While future psychiatric events cannot be accurately predicted, the patient does not currently require acute inpatient psychiatric care and [facility].  Subjective: Patient arrived on time for session.  Patient shared how he was in the process of working on his daughters room expansion in their home, when his wife interceded and stated that she wanted him to have a permit before moving forward.  Patient shared feelings related, frustration as he stated that he was unaware that she felt the strongly about their having apartment.  He shared how they communicate about the situation and ultimately, he agreed to follow through with her request as she expressed strong feelings.  He shared some of their progress in recent marital therapy sessions.  He shared how he continues to work on his communication  and ultimately respond in a way that supportive while seeking understanding.  We reviewed ways to communicate and express himself.   Interventions:CBT, Solution Focused and Strength-based  Diagnosis: No diagnosis found.  Plan:   1.  Patient to continue to engage in individual counseling 2-4 times a month or as needed. 2.  Patient to identify and apply CBT, coping skills learned in session to decrease depression and anxiety symptoms. 3.  Patient to contact this office, go to the local ED or call 911 if a crisis or emergency develops between visits.  Waldron Sessionhristopher Keywon Mestre, [facility]

## 2018-06-30 ENCOUNTER — Ambulatory Visit: Payer: BLUE CROSS/BLUE SHIELD | Admitting: Mental Health

## 2018-07-18 ENCOUNTER — Ambulatory Visit: Payer: BLUE CROSS/BLUE SHIELD | Admitting: Psychiatry

## 2018-07-18 DIAGNOSIS — F411 Generalized anxiety disorder: Secondary | ICD-10-CM | POA: Diagnosis not present

## 2018-07-18 DIAGNOSIS — F39 Unspecified mood [affective] disorder: Secondary | ICD-10-CM | POA: Diagnosis not present

## 2018-07-18 MED ORDER — ESCITALOPRAM OXALATE 10 MG PO TABS
10.0000 mg | ORAL_TABLET | Freq: Every day | ORAL | 2 refills | Status: DC
Start: 2018-07-18 — End: 2019-02-19

## 2018-07-18 NOTE — Progress Notes (Signed)
[facility] Med Check  Patient ID: Jerome Cole,  MRN: [id number]  PCP: Joycelyn Rua, MD  Date of Evaluation: 07/18/2018 Time spent:20 minutes  Chief Complaint:   HISTORY/CURRENT STATUS: HPI patient seen 06/06/2018 and was doing well.  Diagnoses include mood disorder and anxiety.  Patient is on Lexapro 10 mg a day.  We are following mood. Continues to do well.  Mood steady. Interested in changing to TRintellix in future for less problems with ejaculation.  Individual Medical History/ Review of Systems: Changes? :No   Allergies: Patient has no known allergies.  Current Medications:  Current Outpatient Medications:  .  escitalopram (LEXAPRO) 10 MG tablet, Take 1 tablet (10 mg total) by mouth daily., Disp: 30 tablet, Rfl: 1 .  HYDROcodone-acetaminophen (NORCO) 5-325 MG tablet, Take 1-2 tablets by mouth every 6 (six) hours as needed. (Patient not taking: Reported on 06/29/2016), Disp: 90 tablet, Rfl: 0 .  ibuprofen (ADVIL,MOTRIN) 200 MG tablet, Take 200 mg by mouth every 6 (six) hours as needed., Disp: , Rfl:  .  omeprazole (PRILOSEC) 20 MG capsule, Take 20 mg by mouth daily., Disp: , Rfl:  Medication Side Effects: none  Family Medical/ Social History: Changes? No  MENTAL HEALTH EXAM:  There were no vitals taken for this visit.There is no height or weight on file to calculate BMI.  General Appearance: Casual  Eye Contact:  Good  Speech:  Clear and Coherent  Volume:  Normal  Mood:  Euthymic  Affect:  Appropriate  Thought Process:  Goal Directed  Orientation:  Full (Time, Place, and Person)  Thought Content: Logical   Suicidal Thoughts:  No  Homicidal Thoughts:  No  Memory:  WNL  Judgement:  Good  Insight:  Good  Psychomotor Activity:  Normal  Concentration:  Concentration: Good  Recall:  Good  Fund of Knowledge: Good  Language: Good  Assets:  Social Support  ADL's:  Intact  Cognition: WNL  Prognosis:  Good    DIAGNOSES: No diagnosis found.  Receiving  Psychotherapy: Yes    RECOMMENDATIONS: Patient will continue Lexapro 10 mg a day.  He has interested in changing to Trintellix in the future due to ejaculatory  problems on the Lexapro. He has return in 3 months.  Anne Fu, PA-C

## 2018-07-23 ENCOUNTER — Ambulatory Visit: Payer: BLUE CROSS/BLUE SHIELD | Admitting: Mental Health

## 2018-08-22 ENCOUNTER — Ambulatory Visit: Payer: BLUE CROSS/BLUE SHIELD | Admitting: Mental Health

## 2018-08-22 DIAGNOSIS — F39 Unspecified mood [affective] disorder: Secondary | ICD-10-CM

## 2018-09-10 NOTE — Progress Notes (Signed)
      [facility] Counselor/Therapist Progress Note   Patient ID: Jerome Cole, MRN: [id number]  Date: 08/22/18  Timespent: 60 mintues  Treatment Type: Individual  Mental Status Exam:   Appearance:   Casual     Behavior:  Appropriate and Sharing  Motor:  Normal  Speech/Language:   Clear and Coherent  Affect:  full range, anxious  Mood:  congruent  Thought process:  Coherent and Relevant  Thought content:    Logical  Perceptual disturbances:    Normal  Orientation:  Full (Time, Place, and Person)  Attention:  Good  Concentration:  good  Memory:  Immediate  Fund of knowledge:   Good  Insight:    Good  Judgment:   Good  Impulse Control:  good    Reported Symptoms: anxious most days, heart races, rumination, irritability  Risk Assessment: Danger to Self:  No Self-injurious Behavior: No Danger to Others: No Duty to Warn:no Physical Aggression / Violence:No  Access to Firearms a concern: No  Gang Involvement:No   Patient / guardian was educated about steps to take if suicide or homicide risk level increases between visits. While future psychiatric events cannot be accurately predicted, the patient does not currently require acute inpatient psychiatric care and does not currently meet [facility] involuntary commitment criteria.  Subjective: Patient arrived on time for session.  Discussed progress.  He shared how he and his wife continue marriage counseling, feels they are making some improvements, going on to share some relationship details, communication.  Ways to continue to communicate effectively were explored with patient, where he can express himself as needed with his wife without suppressing emotions, feelings.  Patient reports being effective and making progress in these areas as well.  Ways to continue to cope and care for himself were reviewed and explored.  Patient continues to work out several days a week as part of his self-care routine.   Interventions:CBT,  Solution Focused and Strength-based  Diagnosis:   ICD-10-CM   1. Episodic mood disorder (HCC) F39     Plan:   1.  Patient to continue to engage in individual counseling 2-4 times a month or as needed. 2.  Patient to identify and apply CBT, coping skills learned in session to decrease depression and anxiety symptoms. 3.  Patient to contact this office, go to the local ED or call 911 if a crisis or emergency develops between visits.  [name], [facility]

## 2018-09-19 ENCOUNTER — Ambulatory Visit: Payer: BLUE CROSS/BLUE SHIELD | Admitting: Mental Health

## 2018-10-13 ENCOUNTER — Ambulatory Visit (INDEPENDENT_AMBULATORY_CARE_PROVIDER_SITE_OTHER): Payer: BLUE CROSS/BLUE SHIELD | Admitting: Mental Health

## 2018-10-13 ENCOUNTER — Other Ambulatory Visit: Payer: Self-pay

## 2018-10-13 DIAGNOSIS — F39 Unspecified mood [affective] disorder: Secondary | ICD-10-CM | POA: Diagnosis not present

## 2018-10-17 ENCOUNTER — Ambulatory Visit: Payer: BLUE CROSS/BLUE SHIELD | Admitting: Psychiatry

## 2018-10-23 NOTE — Progress Notes (Signed)
[facility] Counselor/Therapist Progress Note   Patient ID: Jerome Cole, MRN: [id number]  Date: 10/13/18  Timespent: 45 mintues  Treatment Type: Individual  Mental Status Exam:   Appearance:   Casual     Behavior:  Appropriate and Sharing  Motor:  Normal  Speech/Language:   Clear and Coherent  Affect:  full range, anxious  Mood:  congruent  Thought process:  Coherent and Relevant  Thought content:    Logical  Perceptual disturbances:    Normal  Orientation:  Full (Time, Place, and Person)  Attention:  Good  Concentration:  good  Memory:  Immediate  Fund of knowledge:   Good  Insight:    Good  Judgment:   Good  Impulse Control:  good    Reported Symptoms: anxious most days, heart races, rumination, irritability  Risk Assessment: Danger to Self:  No Self-injurious Behavior: No Danger to Others: No Duty to Warn:no Physical Aggression / Violence:No  Access to Firearms a concern: No  Gang Involvement:No   Patient / guardian was educated about steps to take if suicide or homicide risk level increases between visits. While future psychiatric events cannot be accurately predicted, the patient does not currently require acute inpatient psychiatric care and does not currently meet [facility] involuntary commitment criteria.  Subjective: Patient engaged in tele-therapy session.  Discussed progress, namely how the viral pandemic is affected his work and personal life.  He continues to work on his communication in his marriage.  Feels he is managing his frustration, irritability more effectively.  Going on to give examples.  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  He shared how his job is secure but he has some anxiety about the next few months if changes do not occur.  Patient expressed thoughts and concerns, having insight into focusing on what he can control versus what he cannot.   Ways to continue to cope and care for himself were  reviewed and explored.  Patient continues to work out several days a week as part of his self-care routine.  Virtual Visit via Telephone Note I connected with patient by a video enabled telemedicine application or telephone, with their informed consent, and verified patient privacy and that I am speaking with the correct person using two identifiers. I discussed the limitations, risks, security and privacy concerns of performing psychotherapy and management service by telephone and the availability of in person appointments. I also discussed with the patient that there may be a patient responsible charge related to this service. The patient expressed understanding and agreed to proceed. I discussed the treatment planning with the patient. The patient was provided an opportunity to ask questions and all were answered. The patient agreed with the plan and demonstrated an understanding of the instructions. The patient was advised to call  our office if  symptoms worsen or feel they are in a crisis state and need immediate contact.   Interventions:CBT, Solution Focused and Strength-based  Diagnosis:   ICD-10-CM   1. Episodic mood disorder (HCC) F39     Plan:   1.  Patient to continue to engage in individual counseling 2-4 times a month or as needed. 2.  Patient to identify and apply CBT, coping skills learned in session to decrease depression and anxiety symptoms. 3.  Patient to contact this office, go to the local ED or call 911 if a crisis or emergency develops between visits.  [name], [facility]

## 2019-01-19 ENCOUNTER — Telehealth: Payer: Self-pay | Admitting: *Deleted

## 2019-01-19 DIAGNOSIS — Z20822 Contact with and (suspected) exposure to covid-19: Secondary | ICD-10-CM

## 2019-01-19 NOTE — Telephone Encounter (Signed)
11:00am appointment for 7/7 at the [facility] site. [place] [phone].

## 2019-01-19 NOTE — Addendum Note (Signed)
Addended by: Tarry Kos on: 01/19/2019 01:09 PM   Modules accepted: Orders

## 2019-01-19 NOTE — Telephone Encounter (Signed)
[phone] testing due to sxs referred by [place]. Informed to wear a mask and stay in vehicle.

## 2019-01-20 ENCOUNTER — Other Ambulatory Visit: Payer: Self-pay | Admitting: Internal Medicine

## 2019-01-20 DIAGNOSIS — Z20822 Contact with and (suspected) exposure to covid-19: Secondary | ICD-10-CM

## 2019-01-20 DIAGNOSIS — Z20828 Contact with and (suspected) exposure to other viral communicable diseases: Secondary | ICD-10-CM | POA: Diagnosis not present

## 2019-01-21 ENCOUNTER — Other Ambulatory Visit: Payer: BLUE CROSS/BLUE SHIELD

## 2019-01-21 DIAGNOSIS — Z20828 Contact with and (suspected) exposure to other viral communicable diseases: Secondary | ICD-10-CM

## 2019-01-21 DIAGNOSIS — Z20822 Contact with and (suspected) exposure to covid-19: Secondary | ICD-10-CM

## 2019-01-25 LAB — NOVEL CORONAVIRUS, NAA: SARS-CoV-2, NAA: NOT DETECTED

## 2019-02-19 ENCOUNTER — Other Ambulatory Visit: Payer: Self-pay

## 2019-02-19 MED ORDER — ESCITALOPRAM OXALATE 10 MG PO TABS: 10.0000 mg | ORAL_TABLET | Freq: Every day | ORAL | 1 refills | Status: DC

## 2019-02-25 ENCOUNTER — Other Ambulatory Visit: Payer: Self-pay

## 2019-02-25 ENCOUNTER — Ambulatory Visit (INDEPENDENT_AMBULATORY_CARE_PROVIDER_SITE_OTHER): Payer: BC Managed Care – PPO | Admitting: Adult Health

## 2019-02-25 ENCOUNTER — Encounter: Payer: Self-pay | Admitting: Adult Health

## 2019-02-25 DIAGNOSIS — F339 Major depressive disorder, recurrent, unspecified: Secondary | ICD-10-CM | POA: Diagnosis not present

## 2019-02-25 DIAGNOSIS — F411 Generalized anxiety disorder: Secondary | ICD-10-CM

## 2019-02-25 MED ORDER — ESCITALOPRAM OXALATE 10 MG PO TABS: 10.0000 mg | ORAL_TABLET | Freq: Every day | ORAL | 1 refills | Status: DC

## 2019-02-25 NOTE — Progress Notes (Signed)
Jerome HatchDave Wessell [id number] 11/24/1970 48 y.o.  Subjective:   Patient ID:  Jerome Cole is a 48 y.o. (DOB 02/10/1971) male.  Chief Complaint: No chief complaint on file.   HPI Jerome HatchDave Lobban presents to the office today for follow-up of depression and anxiety.  Describes mood today as "ok". Mood symptoms - denies depression, anxiety, and irritability. Stating "I'm doing pretty good". 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 Stating "I worry about everything - my job, wife, children, Covid-19". Feels like the Lexapro is working well to manage symptoms and he would like to continue.  Energy levels mostly stable. Feels "tired" at times. Lexapro caused fatigued initially and "still slows me down a year later". Active, exercises regularly. Works full time. Enjoys spending time with family. Married - wife, 2 children 9 ad seven.  Appetite adequate. Weight stable. Sleeps well most nights. Averages 7 to 8  Denies SI or HI. Denies AH or VH.   Review of Systems:  Review of Systems  Musculoskeletal: Negative for gait problem.  Neurological: Negative for tremors.  Psychiatric/Behavioral:       Please refer to HPI    Medications: I have reviewed the patient's current medications.  Current Outpatient Medications  Medication Sig Dispense Refill  . escitalopram (LEXAPRO) 10 MG tablet Take 1 tablet (10 mg total) by mouth daily. 90 tablet 1  . omeprazole (PRILOSEC) 20 MG capsule Take 20 mg by mouth daily.    Marland Kitchen. HYDROcodone-acetaminophen (NORCO) 5-325 MG tablet Take 1-2 tablets by mouth every 6 (six) hours as needed. (Patient not taking: Reported on 06/29/2016) 90 tablet 0  . ibuprofen (ADVIL,MOTRIN) 200 MG tablet Take 200 mg by mouth every 6 (six) hours as needed.     No current facility-administered medications for this visit.     Medication Side Effects: None  Allergies: No Known Allergies  Past Medical History:  Diagnosis Date  . Acid reflux   . Medial meniscus tear 05/2015   right knee     History reviewed. No pertinent family history.  Social History   Socioeconomic History  . Marital status: Married    Spouse name: Not on file  . Number of children: Not on file  . Years of education: Not on file  . Highest education level: Not on file  Occupational History  . Not on file  Social Needs  . Financial resource strain: Not on file  . Food insecurity    Worry: Not on file    Inability: Not on file  . Transportation needs    Medical: Not on file    Non-medical: Not on file  Tobacco Use  . Smoking status: Never Smoker  . Smokeless tobacco: Never Used  Substance and Sexual Activity  . Alcohol use: Yes    Comment: occasionally  . Drug use: No  . Sexual activity: Not on file  Lifestyle  . Physical activity    Days per week: Not on file    Minutes per session: Not on file  . Stress: Not on file  Relationships  . Social Musicianconnections    Talks on phone: Not on file    Gets together: Not on file    Attends religious service: Not on file    Active member of club or organization: Not on file    Attends meetings of clubs or organizations: Not on file    Relationship status: Not on file  . Intimate partner violence    Fear of current or ex partner: Not on file  Emotionally abused: Not on file    Physically abused: Not on file    Forced sexual activity: Not on file  Other Topics Concern  . Not on file  Social History Narrative  . Not on file    Past Medical History, Surgical history, Social history, and Family history were reviewed and updated as appropriate.   Please see review of systems for further details on the patient's review from today.   Objective:   Physical Exam:  There were no vitals taken for this visit.  Physical Exam Constitutional:      General: He is not in acute distress.    Appearance: He is well-developed.  Musculoskeletal:        General: No deformity.  Neurological:     Mental Status: He is alert and oriented to person,  place, and time.     Coordination: Coordination normal.  Psychiatric:        Attention and Perception: Attention and perception normal. He does not perceive auditory or visual hallucinations.        Mood and Affect: Mood normal. Mood is not anxious or depressed. Affect is not labile, blunt, angry or inappropriate.        Speech: Speech normal.        Behavior: Behavior normal.        Thought Content: Thought content normal. Thought content is not paranoid or delusional. Thought content does not include homicidal or suicidal ideation. Thought content does not include homicidal or suicidal plan.        Cognition and Memory: Cognition and memory normal.        Judgment: Judgment normal.     Comments: Insight intact     Lab Review:     Component Value Date/Time   NA 133 (L) 02/17/2012 1154   K 3.5 02/17/2012 1154   CL 95 (L) 02/17/2012 1154   CO2 28 02/17/2012 1154   GLUCOSE 107 (H) 02/17/2012 1154   BUN 10 02/17/2012 1154   CREATININE 0.94 02/17/2012 1154   CALCIUM 9.1 02/17/2012 1154   PROT 7.5 02/17/2012 1154   ALBUMIN 4.2 02/17/2012 1154   AST 32 02/17/2012 1154   ALT 49 02/17/2012 1154   ALKPHOS 104 02/17/2012 1154   BILITOT 0.8 02/17/2012 1154   GFRNONAA >90 02/17/2012 1154   GFRAA >90 02/17/2012 1154       Component Value Date/Time   WBC 11.4 (H) 02/17/2012 1154   RBC 5.02 02/17/2012 1154   HGB 15.3 02/17/2012 1154   HCT 42.0 02/17/2012 1154   PLT 197 02/17/2012 1154   MCV 83.7 02/17/2012 1154   MCH 30.5 02/17/2012 1154   MCHC 36.4 (H) 02/17/2012 1154   RDW 12.6 02/17/2012 1154   LYMPHSABS 1.0 02/17/2012 1154   MONOABS 1.2 (H) 02/17/2012 1154   EOSABS 0.0 02/17/2012 1154   BASOSABS 0.0 02/17/2012 1154    No results found for: POCLITH, LITHIUM   No results found for: PHENYTOIN, PHENOBARB, VALPROATE, CBMZ   .res Assessment: Plan:   Plan:   1. Continue Lexapro 10mg  daily  RTC  Patient advised to contact office with any questions, adverse effects, or  acute worsening in signs and symptoms.  Diagnoses and all orders for this visit:  Episode of recurrent major depressive disorder, unspecified depression episode severity (HCC) -     escitalopram (LEXAPRO) 10 MG tablet; Take 1 tablet (10 mg total) by mouth daily.  GAD (generalized anxiety disorder) -     escitalopram (LEXAPRO) 10 MG  tablet; Take 1 tablet (10 mg total) by mouth daily.     Please see After Visit Summary for patient specific instructions.  Future Appointments  Date Time Provider Grandfather  05/28/2019 12:00 PM Kjuan Seipp, Berdie Ogren, NP [facility] None    No orders of the defined types were placed in this encounter.   -------------------------------

## 2019-05-19 DIAGNOSIS — Z20828 Contact with and (suspected) exposure to other viral communicable diseases: Secondary | ICD-10-CM | POA: Diagnosis not present

## 2019-05-23 DIAGNOSIS — Z20828 Contact with and (suspected) exposure to other viral communicable diseases: Secondary | ICD-10-CM | POA: Diagnosis not present

## 2019-05-28 ENCOUNTER — Ambulatory Visit (INDEPENDENT_AMBULATORY_CARE_PROVIDER_SITE_OTHER): Payer: BC Managed Care – PPO | Admitting: Adult Health

## 2019-05-28 ENCOUNTER — Other Ambulatory Visit: Payer: Self-pay

## 2019-05-28 ENCOUNTER — Encounter: Payer: Self-pay | Admitting: Adult Health

## 2019-05-28 DIAGNOSIS — F339 Major depressive disorder, recurrent, unspecified: Secondary | ICD-10-CM | POA: Diagnosis not present

## 2019-05-28 DIAGNOSIS — F411 Generalized anxiety disorder: Secondary | ICD-10-CM | POA: Diagnosis not present

## 2019-05-28 MED ORDER — ESCITALOPRAM OXALATE 10 MG PO TABS: 10.0000 mg | ORAL_TABLET | Freq: Every day | ORAL | 1 refills | Status: DC

## 2019-05-28 NOTE — Progress Notes (Signed)
Jerome Cole Giuliano [id number] 11/23/1970 48 y.o.  Subjective:   Patient ID:  Jerome Cole Escoe is a 48 y.o. (DOB 03/13/1971) male.  Chief Complaint: No chief complaint on file.   HPI Jerome Cole Bertran presents to the office today for follow-up of depression and anxiety.  Describes mood today as "good". Pleasant. Mood symptoms - denies depression, anxiety, and irritability. Stating "I'm feeling good". Complaining of "excessive sweating" while exercising. Lexapro helps with worry and rumination - stating "I still worry", "more so about my family". Feels like symptoms "managed". Also stating "Lexapro keeps me "well balanced". Stable interest and motivation.  Energy levels mostly stable. Active, exercises regularly. 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 Enjoys spending time with family. Married. Lives with wife and 2 children 9 and 7.  Appetite adequate. Weight stable. Sleeps well most nights. Averages 7 to 8 hours. Denies SI or HI. Denies AH or VH.   Previous medications: Zoloft  Review of Systems:  Review of Systems  Musculoskeletal: Negative for gait problem.  Neurological: Negative for tremors.  Psychiatric/Behavioral:       Please refer to HPI    Medications: I have reviewed the patient's current medications.  Current Outpatient Medications  Medication Sig Dispense Refill  . escitalopram (LEXAPRO) 10 MG tablet Take 1 tablet (10 mg total) by mouth daily. 90 tablet 1  . HYDROcodone-acetaminophen (NORCO) 5-325 MG tablet Take 1-2 tablets by mouth every 6 (six) hours as needed. (Patient not taking: Reported on 06/29/2016) 90 tablet 0  . ibuprofen (ADVIL,MOTRIN) 200 MG tablet Take 200 mg by mouth every 6 (six) hours as needed.    Marland Kitchen. omeprazole (PRILOSEC) 20 MG capsule Take 20 mg by mouth daily.     No current facility-administered medications for this visit.     Medication Side Effects: None  Allergies: No Known Allergies  Past Medical History:  Diagnosis Date  . Acid reflux    . Medial meniscus tear 05/2015   right knee    No family history on file.  Social History   Socioeconomic History  . Marital status: Married    Spouse name: Not on file  . Number of children: Not on file  . Years of education: Not on file  . Highest education level: Not on file  Occupational History  . Not on file  Social Needs  . Financial resource strain: Not on file  . Food insecurity    Worry: Not on file    Inability: Not on file  . Transportation needs    Medical: Not on file    Non-medical: Not on file  Tobacco Use  . Smoking status: Never Smoker  . Smokeless tobacco: Never Used  Substance and Sexual Activity  . Alcohol use: Yes    Comment: occasionally  . Drug use: No  . Sexual activity: Not on file  Lifestyle  . Physical activity    Days per week: Not on file    Minutes per session: Not on file  . Stress: Not on file  Relationships  . Social Musicianconnections    Talks on phone: Not on file    Gets together: Not on file    Attends religious service: Not on file    Active member of club or organization: Not on file    Attends meetings of clubs or organizations: Not on file    Relationship status: Not on file  . Intimate partner violence    Fear of current or ex partner: Not on file  Emotionally abused: Not on file    Physically abused: Not on file    Forced sexual activity: Not on file  Other Topics Concern  . Not on file  Social History Narrative  . Not on file    Past Medical History, Surgical history, Social history, and Family history were reviewed and updated as appropriate.   Please see review of systems for further details on the patient's review from today.   Objective:   Physical Exam:  There were no vitals taken for this visit.  Physical Exam Constitutional:      General: He is not in acute distress.    Appearance: He is well-developed.  Musculoskeletal:        General: No deformity.  Neurological:     Mental Status: He is alert  and oriented to person, place, and time.     Coordination: Coordination normal.  Psychiatric:        Attention and Perception: Attention and perception normal. He does not perceive auditory or visual hallucinations.        Mood and Affect: Mood normal. Mood is not anxious or depressed. Affect is not labile, blunt, angry or inappropriate.        Speech: Speech normal.        Behavior: Behavior normal.        Thought Content: Thought content normal. Thought content is not paranoid or delusional. Thought content does not include homicidal or suicidal ideation. Thought content does not include homicidal or suicidal plan.        Cognition and Memory: Cognition and memory normal.        Judgment: Judgment normal.     Comments: Insight intact     Lab Review:     Component Value Date/Time   NA 133 (L) 02/17/2012 1154   K 3.5 02/17/2012 1154   CL 95 (L) 02/17/2012 1154   CO2 28 02/17/2012 1154   GLUCOSE 107 (H) 02/17/2012 1154   BUN 10 02/17/2012 1154   CREATININE 0.94 02/17/2012 1154   CALCIUM 9.1 02/17/2012 1154   PROT 7.5 02/17/2012 1154   ALBUMIN 4.2 02/17/2012 1154   AST 32 02/17/2012 1154   ALT 49 02/17/2012 1154   ALKPHOS 104 02/17/2012 1154   BILITOT 0.8 02/17/2012 1154   GFRNONAA >90 02/17/2012 1154   GFRAA >90 02/17/2012 1154       Component Value Date/Time   WBC 11.4 (H) 02/17/2012 1154   RBC 5.02 02/17/2012 1154   HGB 15.3 02/17/2012 1154   HCT 42.0 02/17/2012 1154   PLT 197 02/17/2012 1154   MCV 83.7 02/17/2012 1154   MCH 30.5 02/17/2012 1154   MCHC 36.4 (H) 02/17/2012 1154   RDW 12.6 02/17/2012 1154   LYMPHSABS 1.0 02/17/2012 1154   MONOABS 1.2 (H) 02/17/2012 1154   EOSABS 0.0 02/17/2012 1154   BASOSABS 0.0 02/17/2012 1154    No results found for: POCLITH, LITHIUM   No results found for: PHENYTOIN, PHENOBARB, VALPROATE, CBMZ   .res Assessment: Plan:   Plan:   1. Continue Lexapro 10mg  daily  RTC 4 months  Patient advised to contact office with any  questions, adverse effects, or acute worsening in signs and symptoms.  Diagnoses and all orders for this visit:  GAD (generalized anxiety disorder) -     escitalopram (LEXAPRO) 10 MG tablet; Take 1 tablet (10 mg total) by mouth daily.  Episode of recurrent major depressive disorder, unspecified depression episode severity (HCC) -     escitalopram (LEXAPRO)  10 MG tablet; Take 1 tablet (10 mg total) by mouth daily.     Please see After Visit Summary for patient specific instructions.  No future appointments.  No orders of the defined types were placed in this encounter.   -------------------------------

## 2019-07-18 IMAGING — MR MR KNEE*L* W/O CM
6 of 8 series · 27 of 40 positions shown · non-contrast
Comparison: Left knee x-rays dated August 19, 2017. Left knee MRI
dated November 05, 2006.

CLINICAL DATA: Acute left knee pain after basketball injury 2 weeks
ago. Two prior ACL repairs.

EXAM:
MRI OF THE LEFT KNEE WITHOUT CONTRAST
TECHNIQUE: Multiplanar, multisequence MR imaging of the knee was performed.
JHOAN SANTIAGO protocol was used. No intravenous contrast was administered.

[Series 3: PD fat-sat · axial · 4.0mm · 0.50mm/px · z∈[-85,+25]mm · 5 of 24 slices shown]
[im 1/24]
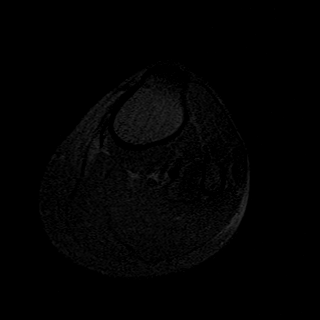
[im 6/24]
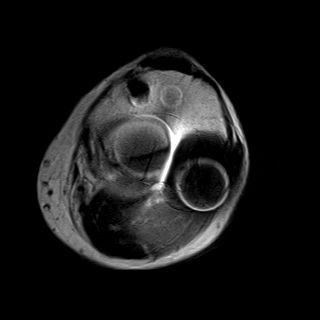
[im 12/24]
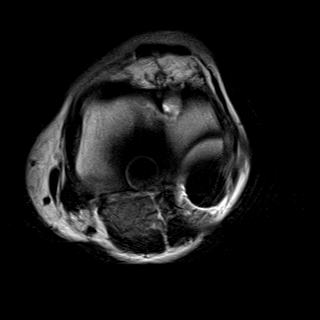
[im 18/24]
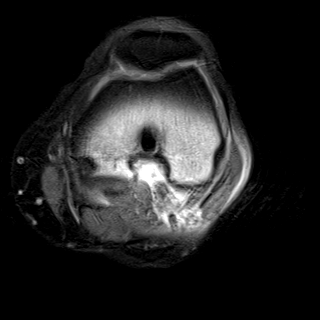
[im 24/24]
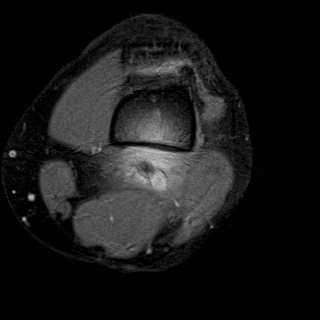

[Series 4: T1 · coronal · 3.5mm · 0.50mm/px · 5 of 28 slices shown]
[im 1/28]
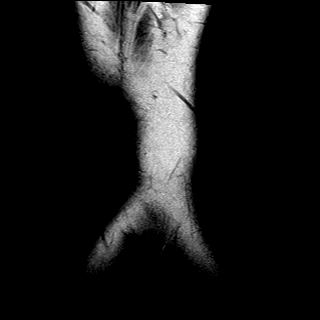
[im 7/28]
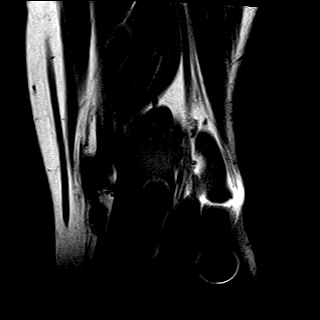
[im 14/28]
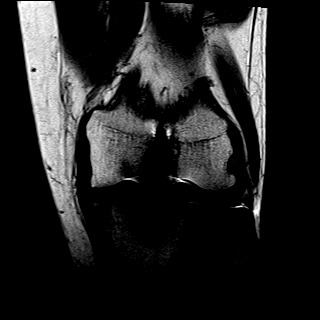
[im 21/28]
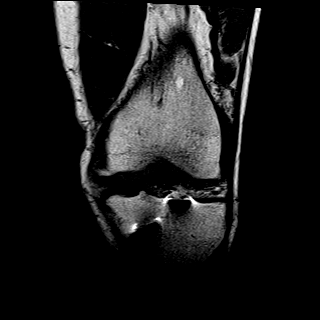
[im 28/28]
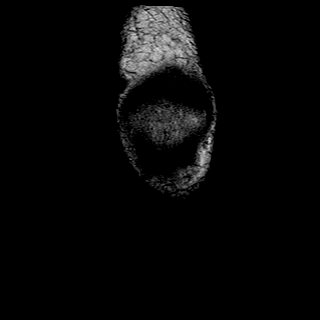

[Series 6: PD · coronal · 3.5mm · 0.31mm/px · 5 of 28 slices shown (1 of 3)]
[im 1/28]
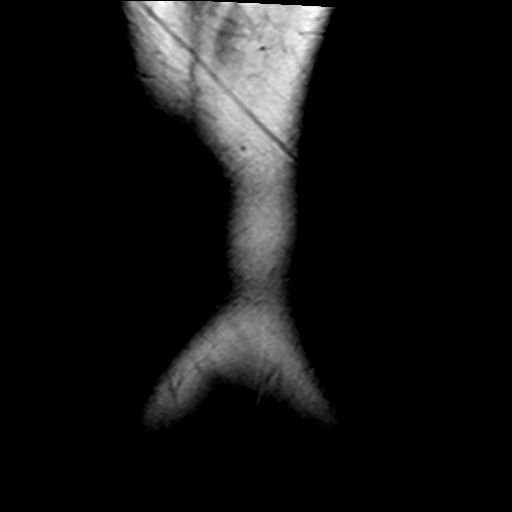
[im 7/28]
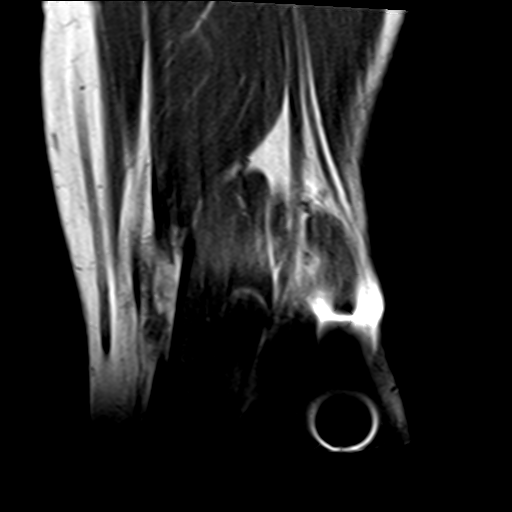
[im 14/28]
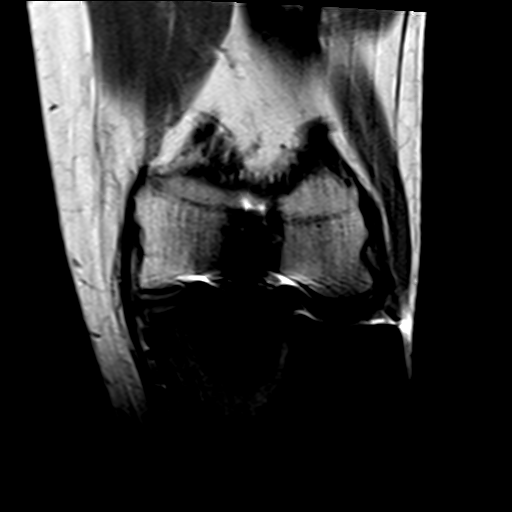
[im 21/28]
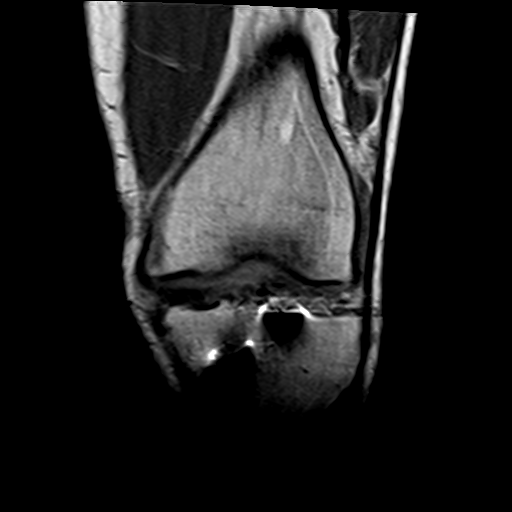
[im 28/28]
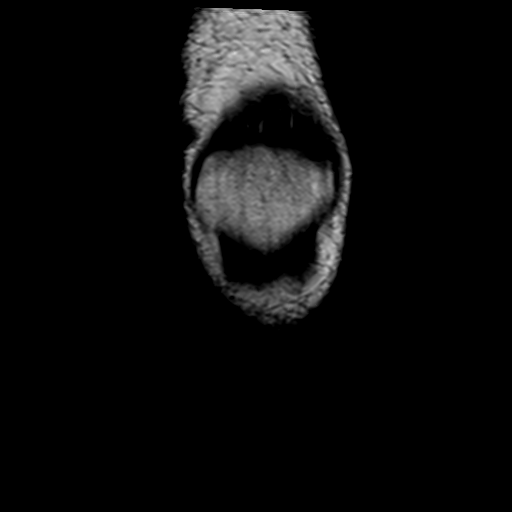

[Series 8: STIR · coronal · 3.5mm · 0.32mm/px · 2 of 28 slices shown]
[im 1/28]
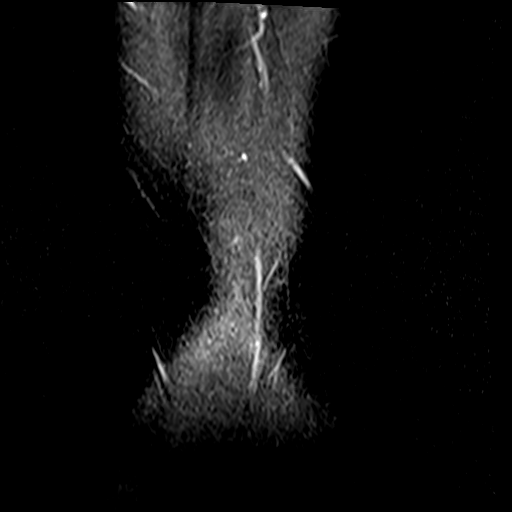
[im 7/28]
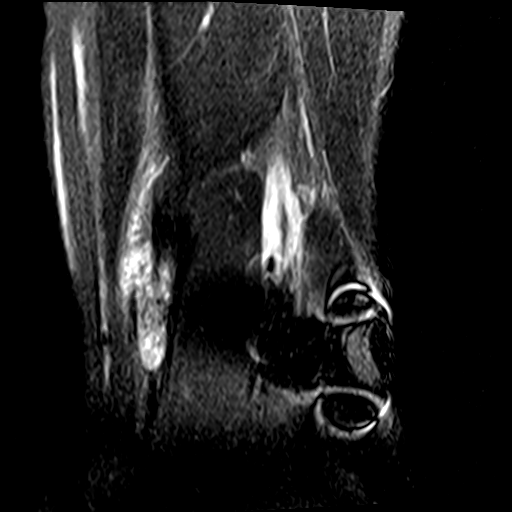

[Series 11: PD · sagittal · 3.5mm · 0.31mm/px · 5 of 25 slices shown (2 of 3)]
[im 1/25]
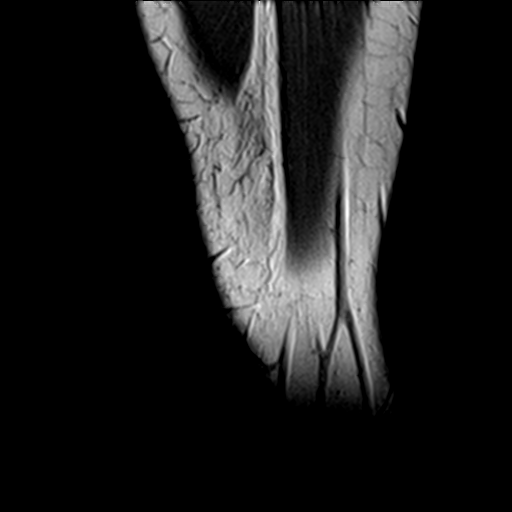
[im 7/25]
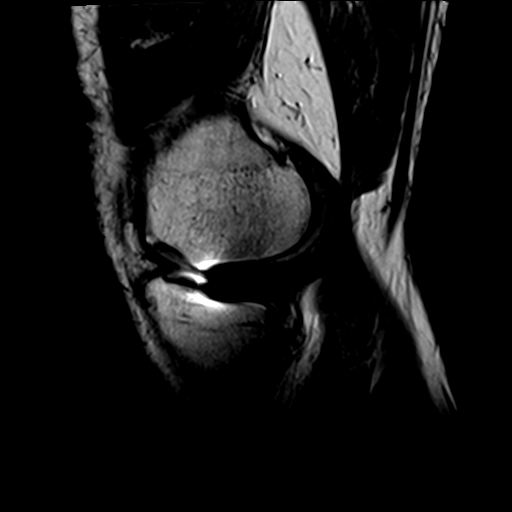
[im 13/25]
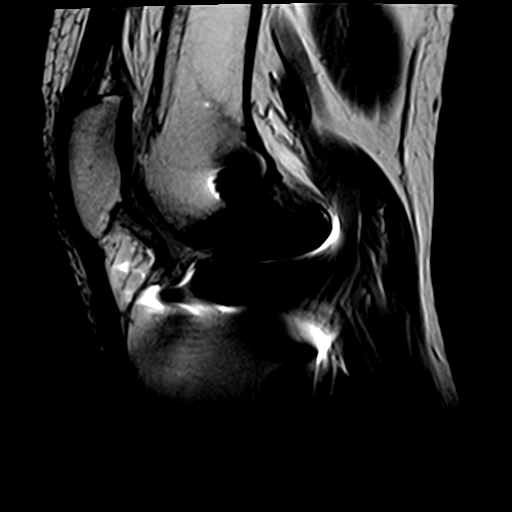
[im 19/25]
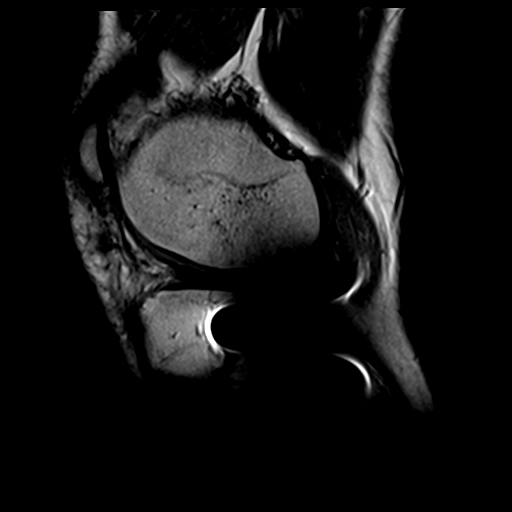
[im 25/25]
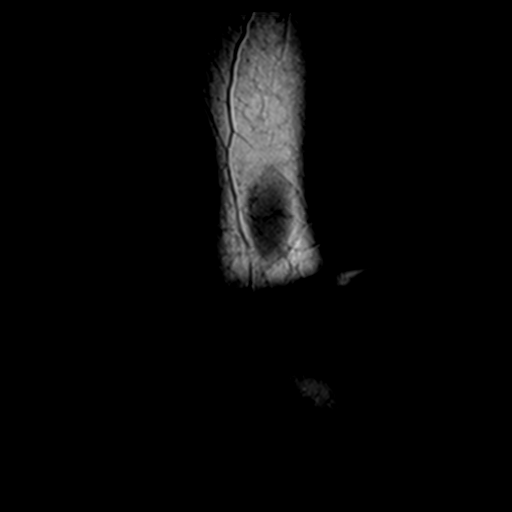

[Series 12: PD · axial · 4.0mm · 0.50mm/px · z∈[-85,+25]mm · 5 of 24 slices shown (3 of 3)]
[im 1/24]
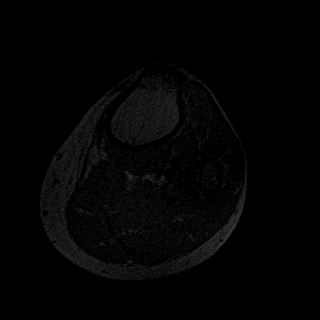
[im 6/24]
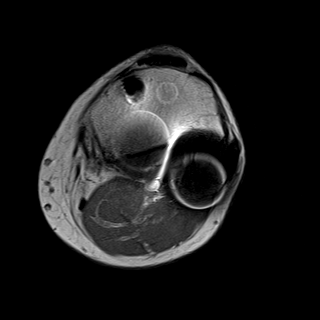
[im 12/24]
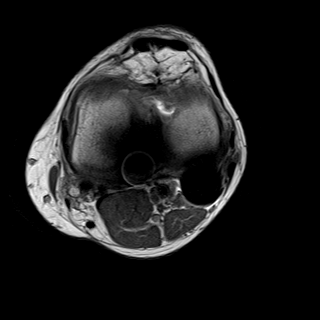
[im 18/24]
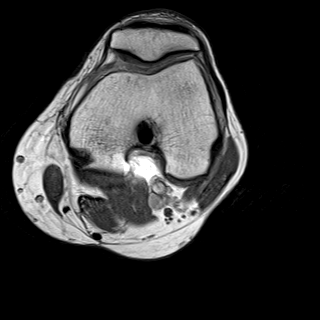
[im 24/24]
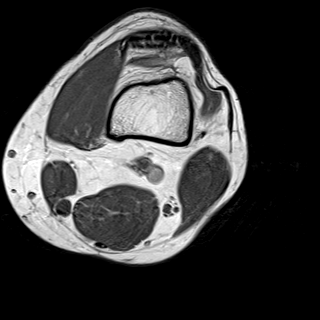

[27 of 40 positions shown; findings below may reference images not displayed]

FINDINGS: MENISCI

Medial meniscus: There is irregularity and blunting of the medial
meniscus body. The posterior horn near the root is not well
evaluated.

Lateral meniscus:  Intact.

LIGAMENTS

Cruciates: The ACL and PCL cannot be evaluated due to susceptibility
artifact related to prior ACL reconstruction.

Collaterals: Medial collateral ligament is intact. Lateral
collateral ligament complex is intact.

CARTILAGE

Patellofemoral: Small partial thickness fissure over the lateral
patellar facet.

Medial:  Mild diffuse thinning without focal defect.

Lateral:  No focal defect.

Joint: No joint effusion. Scarring in Hoffa's fat. Thickening along
the posterior margin of Hoffa's fat could reflect the in inferior
plica.

Popliteal Fossa:  No Baker cyst. Intact popliteus tendon.

Extensor Mechanism: Intact quadriceps tendon and patellar tendon.
Intact medial and lateral patellar retinaculum. Intact MPFL.

Bones: No fracture dislocation. Small tricompartmental osteophytes.

Other: Slight interval decrease in size of the multilobular,
minimally complex cystic lesion extending along the pes anserine
tendons.
IMPRESSION: 1. The ACL and PCL cannot be evaluated due to susceptibility
artifact related to prior ACL reconstruction, despite use of JHOAN SANTIAGO
protocol.
2. Irregularity and blunting of the medial meniscus body, which
could reflect prior meniscectomy versus new tear. Correlate with
surgical history.
3. Mild tricompartmental osteoarthritis.
4. No knee joint effusion.
5. Slight interval decrease in size of multilobular, minimally
complex cystic lesion extending along the pes anserine tendons,
which could represent pes anserine bursitis versus ganglion cyst.

## 2019-07-27 DIAGNOSIS — Z20828 Contact with and (suspected) exposure to other viral communicable diseases: Secondary | ICD-10-CM | POA: Diagnosis not present

## 2019-07-27 DIAGNOSIS — H5711 Ocular pain, right eye: Secondary | ICD-10-CM | POA: Diagnosis not present

## 2019-09-24 ENCOUNTER — Ambulatory Visit: Payer: BC Managed Care – PPO | Admitting: Adult Health

## 2019-09-29 ENCOUNTER — Encounter: Payer: Self-pay | Admitting: Adult Health

## 2019-09-29 ENCOUNTER — Ambulatory Visit (INDEPENDENT_AMBULATORY_CARE_PROVIDER_SITE_OTHER): Payer: BC Managed Care – PPO | Admitting: Adult Health

## 2019-09-29 ENCOUNTER — Other Ambulatory Visit: Payer: Self-pay

## 2019-09-29 DIAGNOSIS — F411 Generalized anxiety disorder: Secondary | ICD-10-CM

## 2019-09-29 DIAGNOSIS — F339 Major depressive disorder, recurrent, unspecified: Secondary | ICD-10-CM

## 2019-09-29 MED ORDER — ESCITALOPRAM OXALATE 10 MG PO TABS: 10.0000 mg | ORAL_TABLET | Freq: Every day | ORAL | 3 refills | Status: DC

## 2019-09-29 NOTE — Progress Notes (Signed)
Bertis Hustead [id number] 12-31-1970 49 y.o.  Subjective:   Patient ID:  Jerome Cole is a 49 y.o. (DOB 22-Jan-1971) male.  Chief Complaint: No chief complaint on file.   HPI Jerome Cole presents to the office today for follow-up of depression and anxiety.  Describes mood today as "ok". Pleasant. Mood symptoms - denies depression, anxiety, and irritability. Stating "I'm feeling pretty good". Decreased worry and rumination - "just typical stuff". Stable interest and motivation. Taking medications as prescribed.  Energy levels mostly stable. Active, has a regular exercise routine. 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 Enjoys some usual interest and activities. Married. Lives with wife of 12 years and 2 children 9 and 7. Mother in law local. Appetite adequate. Weight stable. Sleeps well most nights. Averages 7 to 8 hours. Denies SI or HI. Denies AH or VH.   Previous medications: Zoloft  Review of Systems:  Review of Systems  Musculoskeletal: Negative for gait problem.  Neurological: Negative for tremors.  Psychiatric/Behavioral:       Please refer to HPI    Medications: I have reviewed the patient's current medications.  Current Outpatient Medications  Medication Sig Dispense Refill  . escitalopram (LEXAPRO) 10 MG tablet Take 1 tablet (10 mg total) by mouth daily. 90 tablet 1  . HYDROcodone-acetaminophen (NORCO) 5-325 MG tablet Take 1-2 tablets by mouth every 6 (six) hours as needed. (Patient not taking: Reported on 06/29/2016) 90 tablet 0  . ibuprofen (ADVIL,MOTRIN) 200 MG tablet Take 200 mg by mouth every 6 (six) hours as needed.    Marland Kitchen omeprazole (PRILOSEC) 20 MG capsule Take 20 mg by mouth daily.     No current facility-administered medications for this visit.    Medication Side Effects: None  Allergies: No Known Allergies  Past Medical History:  Diagnosis Date  . Acid reflux   . Medial meniscus tear 05/2015   right knee    No family history on  file.  Social History   Socioeconomic History  . Marital status: Married    Spouse name: Not on file  . Number of children: Not on file  . Years of education: Not on file  . Highest education level: Not on file  Occupational History  . Not on file  Tobacco Use  . Smoking status: Never Smoker  . Smokeless tobacco: Never Used  Substance and Sexual Activity  . Alcohol use: Yes    Comment: occasionally  . Drug use: No  . Sexual activity: Not on file  Other Topics Concern  . Not on file  Social History Narrative  . Not on file   Social Determinants of Health   Financial Resource Strain:   . Difficulty of Paying Living Expenses:   Food Insecurity:   . Worried About Programme researcher, broadcasting/film/video in the Last Year:   . Barista in the Last Year:   Transportation Needs:   . Freight forwarder (Medical):   Marland Kitchen Lack of Transportation (Non-Medical):   Physical Activity:   . Days of Exercise per Week:   . Minutes of Exercise per Session:   Stress:   . Feeling of Stress :   Social Connections:   . Frequency of Communication with Friends and Family:   . Frequency of Social Gatherings with Friends and Family:   . Attends Religious Services:   . Active Member of Clubs or Organizations:   . Attends Banker Meetings:   Marland Kitchen Marital Status:   Intimate Partner Violence:   .  Fear of Current or Ex-Partner:   . Emotionally Abused:   Marland Kitchen Physically Abused:   . Sexually Abused:     Past Medical History, Surgical history, Social history, and Family history were reviewed and updated as appropriate.   Please see review of systems for further details on the patient's review from today.   Objective:   Physical Exam:  There were no vitals taken for this visit.  Physical Exam Constitutional:      General: He is not in acute distress. Musculoskeletal:        General: No deformity.  Neurological:     Mental Status: He is alert and oriented to person, place, and time.      Coordination: Coordination normal.  Psychiatric:        Attention and Perception: Attention and perception normal. He does not perceive auditory or visual hallucinations.        Mood and Affect: Mood normal. Mood is not anxious or depressed. Affect is not labile, blunt, angry or inappropriate.        Speech: Speech normal.        Behavior: Behavior normal.        Thought Content: Thought content normal. Thought content is not paranoid or delusional. Thought content does not include homicidal or suicidal ideation. Thought content does not include homicidal or suicidal plan.        Cognition and Memory: Cognition and memory normal.        Judgment: Judgment normal.     Comments: Insight intact     Lab Review:     Component Value Date/Time   NA 133 (L) 02/17/2012 1154   K 3.5 02/17/2012 1154   CL 95 (L) 02/17/2012 1154   CO2 28 02/17/2012 1154   GLUCOSE 107 (H) 02/17/2012 1154   BUN 10 02/17/2012 1154   CREATININE 0.94 02/17/2012 1154   CALCIUM 9.1 02/17/2012 1154   PROT 7.5 02/17/2012 1154   ALBUMIN 4.2 02/17/2012 1154   AST 32 02/17/2012 1154   ALT 49 02/17/2012 1154   ALKPHOS 104 02/17/2012 1154   BILITOT 0.8 02/17/2012 1154   GFRNONAA >90 02/17/2012 1154   GFRAA >90 02/17/2012 1154       Component Value Date/Time   WBC 11.4 (H) 02/17/2012 1154   RBC 5.02 02/17/2012 1154   HGB 15.3 02/17/2012 1154   HCT 42.0 02/17/2012 1154   PLT 197 02/17/2012 1154   MCV 83.7 02/17/2012 1154   MCH 30.5 02/17/2012 1154   MCHC 36.4 (H) 02/17/2012 1154   RDW 12.6 02/17/2012 1154   LYMPHSABS 1.0 02/17/2012 1154   MONOABS 1.2 (H) 02/17/2012 1154   EOSABS 0.0 02/17/2012 1154   BASOSABS 0.0 02/17/2012 1154    No results found for: POCLITH, LITHIUM   No results found for: PHENYTOIN, PHENOBARB, VALPROATE, CBMZ   .res Assessment: Plan:    Plan:   1. Continue Lexapro 10mg  daily  RTC 6 months  Patient advised to contact office with any questions, adverse effects, or acute  worsening in signs and symptoms.  There are no diagnoses linked to this encounter.   Please see After Visit Summary for patient specific instructions.  No future appointments.  No orders of the defined types were placed in this encounter.   -------------------------------

## 2019-10-10 DIAGNOSIS — R0981 Nasal congestion: Secondary | ICD-10-CM | POA: Diagnosis not present

## 2019-10-10 DIAGNOSIS — Z03818 Encounter for observation for suspected exposure to other biological agents ruled out: Secondary | ICD-10-CM | POA: Diagnosis not present

## 2019-10-10 DIAGNOSIS — Z20828 Contact with and (suspected) exposure to other viral communicable diseases: Secondary | ICD-10-CM | POA: Diagnosis not present

## 2019-10-11 DIAGNOSIS — R0981 Nasal congestion: Secondary | ICD-10-CM | POA: Diagnosis not present

## 2019-11-14 ENCOUNTER — Telehealth: Payer: Self-pay | Admitting: Psychiatry

## 2019-11-14 NOTE — Telephone Encounter (Signed)
Received after hours call from pt's wife. She reports that they are in the process of separating and she is planning to move out this Friday and he is having a difficult time with this and wants to reconcile. She reports that they have had recent arguments and pt has made threats of wanting to hang himself from a tree in their yard. She reports that he made a statement that if he hung himself in the yard then he would never leave their home. Wife asking what she should do. Recommended taking pt to [facility] ER for assessment/ further evaluation. Recommend that she contact the police/dial 911 if he refuses to go for evaluation and she is concerned that there is an imminent risk that he may harm himself. Wife verbalized understanding and reports that she plans to try to take him to the ER. mo

## 2019-11-16 NOTE — Telephone Encounter (Signed)
Can we follow up with him today?

## 2019-11-16 NOTE — Telephone Encounter (Signed)
Left patient a voicemail to call back

## 2019-11-18 NOTE — Telephone Encounter (Signed)
Left voice mail to call back 

## 2020-01-13 ENCOUNTER — Other Ambulatory Visit: Payer: Self-pay

## 2020-01-13 ENCOUNTER — Ambulatory Visit (INDEPENDENT_AMBULATORY_CARE_PROVIDER_SITE_OTHER): Payer: BC Managed Care – PPO | Admitting: Mental Health

## 2020-01-13 DIAGNOSIS — F411 Generalized anxiety disorder: Secondary | ICD-10-CM
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
## 2020-02-08 ENCOUNTER — Ambulatory Visit: Payer: BC Managed Care – PPO | Admitting: Mental Health

## 2020-03-29 ENCOUNTER — Encounter: Payer: Self-pay | Admitting: Adult Health

## 2020-03-29 ENCOUNTER — Other Ambulatory Visit: Payer: Self-pay

## 2020-03-29 ENCOUNTER — Ambulatory Visit (INDEPENDENT_AMBULATORY_CARE_PROVIDER_SITE_OTHER): Payer: BC Managed Care – PPO | Admitting: Adult Health

## 2020-03-29 DIAGNOSIS — F411 Generalized anxiety disorder: Secondary | ICD-10-CM

## 2020-03-29 DIAGNOSIS — F339 Major depressive disorder, recurrent, unspecified: Secondary | ICD-10-CM | POA: Diagnosis not present

## 2020-03-29 MED ORDER — ESCITALOPRAM OXALATE 10 MG PO TABS: 10.0000 mg | ORAL_TABLET | Freq: Every day | ORAL | 3 refills | Status: DC

## 2020-03-29 NOTE — Progress Notes (Signed)
Jerome Cole [id number] 03/09/1971 49 y.o.  Subjective:   Patient ID:  Jerome Cole is a 49 y.o. (DOB Oct 08, 1970) male.  Chief Complaint: No chief complaint on file.   HPI Jerome Cole presents to the office today for follow-up of depression and anxiety.  Describes mood today as "ok". Pleasant. Mood symptoms - reports some depression, anxiety, and irritability. He and wife have separated since last visit. Working through divorce proceedings. Worried about children - 7 on and 7 off custody agreement. Stating "I'm doing alright". Feels like Lexapro continues to work well. Stable interest and motivation. Taking medications as prescribed.  Energy levels mostly stable. Active, has a regular exercise routine.  Enjoys some usual interest and activities. Married. Separated - going through divorce. Lives at house. Has 2 children 11 and 8.    Appetite adequate. Weight stable. Sleeps well most nights. Averages 7 to 8 hours. Focus and concentration difficulties. Completing tasks. Managing aspects of household. Works full time Scientist, research (physical sciences) - [employer] Denies SI or HI. Denies AH or VH.     Previous medications: Zoloft    Review of Systems:  Review of Systems  Musculoskeletal: Negative for gait problem.  Neurological: Negative for tremors.  Psychiatric/Behavioral:       Please refer to HPI    Medications: I have reviewed the patient's current medications.  Current Outpatient Medications  Medication Sig Dispense Refill  . escitalopram (LEXAPRO) 10 MG tablet Take 1 tablet (10 mg total) by mouth daily. 90 tablet 3  . HYDROcodone-acetaminophen (NORCO) 5-325 MG tablet Take 1-2 tablets by mouth every 6 (six) hours as needed. (Patient not taking: Reported on 06/29/2016) 90 tablet 0  . ibuprofen (ADVIL,MOTRIN) 200 MG tablet Take 200 mg by mouth every 6 (six) hours as needed.    Marland Kitchen omeprazole (PRILOSEC) 20 MG capsule Take 20 mg by mouth daily.     No current facility-administered  medications for this visit.    Medication Side Effects: None  Allergies: No Known Allergies  Past Medical History:  Diagnosis Date  . Acid reflux   . Medial meniscus tear 05/2015   right knee    No family history on file.  Social History   Socioeconomic History  . Marital status: Married    Spouse name: Not on file  . Number of children: Not on file  . Years of education: Not on file  . Highest education level: Not on file  Occupational History  . Not on file  Tobacco Use  . Smoking status: Never Smoker  . Smokeless tobacco: Never Used  Substance and Sexual Activity  . Alcohol use: Yes    Comment: occasionally  . Drug use: No  . Sexual activity: Not on file  Other Topics Concern  . Not on file  Social History Narrative  . Not on file   Social Determinants of Health   Financial Resource Strain:   . Difficulty of Paying Living Expenses: Not on file  Food Insecurity:   . Worried About Programme researcher, broadcasting/film/video in the Last Year: Not on file  . Ran Out of Food in the Last Year: Not on file  Transportation Needs:   . Lack of Transportation (Medical): Not on file  . Lack of Transportation (Non-Medical): Not on file  Physical Activity:   . Days of Exercise per Week: Not on file  . Minutes of Exercise per Session: Not on file  Stress:   . Feeling of Stress : Not on file  Social Connections:   .  Frequency of Communication with Friends and Family: Not on file  . Frequency of Social Gatherings with Friends and Family: Not on file  . Attends Religious Services: Not on file  . Active Member of Clubs or Organizations: Not on file  . Attends Banker Meetings: Not on file  . Marital Status: Not on file  Intimate Partner Violence:   . Fear of Current or Ex-Partner: Not on file  . Emotionally Abused: Not on file  . Physically Abused: Not on file  . Sexually Abused: Not on file    Past Medical History, Surgical history, Social history, and Family history were  reviewed and updated as appropriate.   Please see review of systems for further details on the patient's review from today.   Objective:   Physical Exam:  There were no vitals taken for this visit.  Physical Exam Constitutional:      General: He is not in acute distress. Musculoskeletal:        General: No deformity.  Neurological:     Mental Status: He is alert and oriented to person, place, and time.     Coordination: Coordination normal.  Psychiatric:        Attention and Perception: Attention and perception normal. He does not perceive auditory or visual hallucinations.        Mood and Affect: Mood normal. Mood is not anxious or depressed. Affect is not labile, blunt, angry or inappropriate.        Speech: Speech normal.        Behavior: Behavior normal.        Thought Content: Thought content normal. Thought content is not paranoid or delusional. Thought content does not include homicidal or suicidal ideation. Thought content does not include homicidal or suicidal plan.        Cognition and Memory: Cognition and memory normal.        Judgment: Judgment normal.     Comments: Insight intact     Lab Review:     Component Value Date/Time   NA 133 (L) 02/17/2012 1154   K 3.5 02/17/2012 1154   CL 95 (L) 02/17/2012 1154   CO2 28 02/17/2012 1154   GLUCOSE 107 (H) 02/17/2012 1154   BUN 10 02/17/2012 1154   CREATININE 0.94 02/17/2012 1154   CALCIUM 9.1 02/17/2012 1154   PROT 7.5 02/17/2012 1154   ALBUMIN 4.2 02/17/2012 1154   AST 32 02/17/2012 1154   ALT 49 02/17/2012 1154   ALKPHOS 104 02/17/2012 1154   BILITOT 0.8 02/17/2012 1154   GFRNONAA >90 02/17/2012 1154   GFRAA >90 02/17/2012 1154       Component Value Date/Time   WBC 11.4 (H) 02/17/2012 1154   RBC 5.02 02/17/2012 1154   HGB 15.3 02/17/2012 1154   HCT 42.0 02/17/2012 1154   PLT 197 02/17/2012 1154   MCV 83.7 02/17/2012 1154   MCH 30.5 02/17/2012 1154   MCHC 36.4 (H) 02/17/2012 1154   RDW 12.6 02/17/2012  1154   LYMPHSABS 1.0 02/17/2012 1154   MONOABS 1.2 (H) 02/17/2012 1154   EOSABS 0.0 02/17/2012 1154   BASOSABS 0.0 02/17/2012 1154    No results found for: POCLITH, LITHIUM   No results found for: PHENYTOIN, PHENOBARB, VALPROATE, CBMZ   .res Assessment: Plan:    Plan:   1. Continue Lexapro 10mg  daily  RTC 6 months  Patient advised to contact office with any questions, adverse effects, or acute worsening in signs and symptoms.  Diagnoses and  all orders for this visit:  GAD (generalized anxiety disorder) -     escitalopram (LEXAPRO) 10 MG tablet; Take 1 tablet (10 mg total) by mouth daily.  Episode of recurrent major depressive disorder, unspecified depression episode severity (HCC) -     escitalopram (LEXAPRO) 10 MG tablet; Take 1 tablet (10 mg total) by mouth daily.     Please see After Visit Summary for patient specific instructions.  No future appointments.  No orders of the defined types were placed in this encounter.   -------------------------------

## 2020-04-21 ENCOUNTER — Ambulatory Visit (INDEPENDENT_AMBULATORY_CARE_PROVIDER_SITE_OTHER): Payer: BC Managed Care – PPO | Admitting: Mental Health

## 2020-04-21 ENCOUNTER — Other Ambulatory Visit: Payer: Self-pay

## 2020-04-21 DIAGNOSIS — F411 Generalized anxiety disorder: Secondary | ICD-10-CM | POA: Diagnosis not present

## 2020-04-21 NOTE — Progress Notes (Signed)
      [facility] Counselor/Therapist Progress Note   Patient ID: Jerome Cole, MRN: [id number]  Date: 04/21/20  Timespent: 53 mintues  Treatment Type: Individual therapy  Mental Status Exam:   Appearance:   Casual     Behavior:  Appropriate and Sharing  Motor:  Normal  Speech/Language:   Clear and Coherent  Affect:  full range, anxious  Mood:  congruent  Thought process:  Coherent and Relevant  Thought content:    Logical  Perceptual disturbances:    Normal  Orientation:  Full (Time, Place, and Person)  Attention:  Good  Concentration:  good  Memory:  Immediate  Fund of knowledge:   Good  Insight:    Good  Judgment:   Good  Impulse Control:  good    Reported Symptoms: anxiety,rumination, irritability at times  Risk Assessment: Danger to Self:  No Self-injurious Behavior: No Danger to Others: No Duty to Warn:no Physical Aggression / Violence:No  Access to Firearms a concern: No  Gang Involvement:No   Patient / guardian was educated about steps to take if suicide or homicide risk level increases between visits. While future psychiatric events cannot be accurately predicted, the patient does not currently require acute inpatient psychiatric care and does not currently meet [facility] involuntary commitment criteria.  Subjective:  Patient presents for session on time.  He continued to express thoughts and feelings related to his marital separation.  He stated that he and his wife have finalized their assets working with her attorneys collaboratively.  He stated that the custody plan is for each of them to have the children equally, alternating weeks.  He continues to process feelings related to the significant life change, being able to move forward with his life after separation and expressing concerns of how this has and may continue to affect their children.  He expresses a desire to coparent, however, he stated his wife struggles to reciprocate communication this  needed at times.  He continues to also express increased stress of trying to manage his work schedule, while also meeting the needs of the various appointments his children have whether it be sports, therapy or other activities as he has no assistance such as parents or extended family.  Facilitated his identifying ways he feels he is effectively trying to communicate with his wife and his attempts to coparent.  He shared that he tries to send emails and/or text to communicate in a respectful and honest manner regarding the needs of their children.  Interventions:CBT, Solution Focused and Strength-based  Diagnosis:   ICD-10-CM   1. GAD (generalized anxiety disorder)  F41.1    Plan:   1.  Patient to continue to engage in individual counseling 2-4 times a month or as needed. 2.  Patient to identify and apply CBT, coping skills learned in session to decrease depression and anxiety symptoms.   3.  Patient to utilize effective communication skills to express his needs. 4.  Patient to identify outlets for stress management, such as working out/exercising. 5.  patient to contact this office, go to the local ED or call 911 if a crisis or emergency develops between visits.  [name], [facility]

## 2020-05-05 ENCOUNTER — Ambulatory Visit (INDEPENDENT_AMBULATORY_CARE_PROVIDER_SITE_OTHER): Payer: BC Managed Care – PPO | Admitting: Mental Health

## 2020-05-05 ENCOUNTER — Other Ambulatory Visit: Payer: Self-pay

## 2020-05-05 DIAGNOSIS — F411 Generalized anxiety disorder: Secondary | ICD-10-CM

## 2020-05-05 NOTE — Progress Notes (Signed)
      [facility] Counselor/Therapist Progress Note   Patient ID: Jerome Cole, MRN: [id number]  Date: 05/05/20  Timespent: 52 mintues  Treatment Type: Individual therapy  Mental Status Exam:   Appearance:   Casual     Behavior:  Appropriate and Sharing  Motor:  Normal  Speech/Language:   Clear and Coherent  Affect:   Full range  Mood:  congruent  Thought process:  Coherent and Relevant  Thought content:    Logical  Perceptual disturbances:    Normal  Orientation:  Full (Time, Place, and Person)  Attention:  Good  Concentration:  good  Memory:  Immediate  Fund of knowledge:   Good  Insight:    Good  Judgment:   Good  Impulse Control:  good    Reported Symptoms: anxiety,rumination, irritability at times  Risk Assessment: Danger to Self:  No Self-injurious Behavior: No Danger to Others: No Duty to Warn:no Physical Aggression / Violence:No  Access to Firearms a concern: No  Gang Involvement:No   Patient / guardian was educated about steps to take if suicide or homicide risk level increases between visits. While future psychiatric events cannot be accurately predicted, the patient does not currently require acute inpatient psychiatric care and does not currently meet [facility] involuntary commitment criteria.  Subjective:  Patient presents for session in no distress.  Assessed progress, recent events.  He continues to share his efforts and attempting to coparent with his wife with him he remain separated.  He stated that he has continued to express concerns related to his missing considerable amounts of work due to the many frequent scheduled appointments and activities involving his children, how he has no support system with which to help him with transportation at times.  He stated that he attempted to also communicate with his wife specifically around working with him and rescheduling some of their therapy appointments due to it being another factor in his missing  more time from work.  He states she has the support from her parents to assist her when it is a week with their 2 children with transportation, homework etc.  He stated that he often will not get a reply from her as he often makes these attempts through email.  Through guided discovery, he identified missing having a family, the thought of being being together as a family.  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  Provide support and understanding as he processed various feelings throughout the session.  He identified some thoughts and feelings related to hopefulness about the future and his commitment to his role as a father to his children.   Interventions:CBT, Solution Focused and Strength-based  Diagnosis:   ICD-10-CM   1. GAD (generalized anxiety disorder)  F41.1     Plan:   1.  Patient to continue to engage in individual counseling 2-4 times a month or as needed. 2.  Patient to identify and apply CBT, coping skills learned in session to decrease depression and anxiety symptoms. 3.  Patient to contact this office, go to the local ED or call 911 if a crisis or emergency develops between visits.  [name], [facility]

## 2020-05-19 ENCOUNTER — Other Ambulatory Visit: Payer: Self-pay

## 2020-05-19 ENCOUNTER — Ambulatory Visit (INDEPENDENT_AMBULATORY_CARE_PROVIDER_SITE_OTHER): Payer: BC Managed Care – PPO | Admitting: Mental Health

## 2020-05-19 DIAGNOSIS — F339 Major depressive disorder, recurrent, unspecified: Secondary | ICD-10-CM | POA: Diagnosis not present

## 2020-05-19 DIAGNOSIS — F411 Generalized anxiety disorder: Secondary | ICD-10-CM

## 2020-05-19 NOTE — Progress Notes (Signed)
      [facility] Counselor/Therapist Progress Note   Patient ID: Jerome Cole, MRN: [id number]  Date: 05/19/20  Timespent: 52 mintues  Treatment Type: Individual therapy  Mental Status Exam:   Appearance:   Casual     Behavior:  Appropriate and Sharing  Motor:  Normal  Speech/Language:   Clear and Coherent  Affect:   Full range  Mood:  congruent  Thought process:  Coherent and Relevant  Thought content:    Logical  Perceptual disturbances:    Normal  Orientation:  Full (Time, Place, and Person)  Attention:  Good  Concentration:  good  Memory:  Immediate  Fund of knowledge:   Good  Insight:    Good  Judgment:   Good  Impulse Control:  good    Reported Symptoms: anxiety,rumination, irritability at times  Risk Assessment: Danger to Self:  No Self-injurious Behavior: No Danger to Others: No Duty to Warn:no Physical Aggression / Violence:No  Access to Firearms a concern: No  Gang Involvement:No   Patient / guardian was educated about steps to take if suicide or homicide risk level increases between visits. While future psychiatric events cannot be accurately predicted, the patient does not currently require acute inpatient psychiatric care and does not currently meet [facility] involuntary commitment criteria.  Subjective:  Patient presents for session in no distress.  He shared the continued stress of trying to manage his schedule since the marital separation.  He stated that he recently attended a family therapy session with his wife where he expressed trying to communicate to coparent in the relationship for the children, often struggling due to his wife not responding to his text, emails.  He stated his wife has the support of her mother locally where she can assist during weeks that she has no children with homework, transportation etc.  He stated that their grandmother might be able to help with some transportation at times.  He expressed no understanding reasons  why his wife will not be more communicative with him as he makes the communications centered around her children's needs.  Provide support as he processed some feelings also related to the impact of how this may be affecting her children, his wanting effective communication with his wife as part of minimizing the impact on her children as they adjust to the various changes resulting from their separation.  Interventions:CBT, Solution Focused and Strength-based  Diagnosis:   ICD-10-CM   1. GAD (generalized anxiety disorder)  F41.1   2. Episode of recurrent major depressive disorder, unspecified depression episode severity (HCC)  F33.9     Plan:   1.  Patient to continue to engage in individual counseling 2-4 times a month or as needed. 2.  Patient to identify and apply CBT, coping skills learned in session to decrease depression and anxiety symptoms.   3. Patient to utilize effective communication skills to express his needs. 4.  Patient to identify outlets for stress management, such as working out/exercising. 5.  patient to contact this office, go to the local ED or call 911 if a crisis or emergency develops between visits.  [name], [facility]

## 2020-06-02 ENCOUNTER — Ambulatory Visit: Payer: BC Managed Care – PPO | Admitting: Mental Health

## 2020-06-16 ENCOUNTER — Ambulatory Visit (INDEPENDENT_AMBULATORY_CARE_PROVIDER_SITE_OTHER): Payer: BC Managed Care – PPO | Admitting: Mental Health

## 2020-06-16 ENCOUNTER — Other Ambulatory Visit: Payer: Self-pay

## 2020-06-16 DIAGNOSIS — F411 Generalized anxiety disorder: Secondary | ICD-10-CM | POA: Diagnosis not present

## 2020-06-16 NOTE — Progress Notes (Signed)
[facility] Counselor/Therapist Progress Note   Patient ID: Jerome Cole, MRN: [id number]  Date: 06/16/20  Timespent: 53 mintues  Treatment Type: Individual therapy  Mental Status Exam:   Appearance:   Casual     Behavior:  Appropriate and Sharing  Motor:  Normal  Speech/Language:   Clear and Coherent  Affect:   Full range  Mood:  congruent  Thought process:  Coherent and Relevant  Thought content:    Logical  Perceptual disturbances:    Normal  Orientation:  Full (Time, Place, and Person)  Attention:  Good  Concentration:  good  Memory:  Immediate  Fund of knowledge:   Good  Insight:    Good  Judgment:   Good  Impulse Control:  good    Reported Symptoms: anxiety,rumination, irritability at times  Risk Assessment: Danger to Self:  No Self-injurious Behavior: No Danger to Others: No Duty to Warn:no Physical Aggression / Violence:No  Access to Firearms a concern: No  Gang Involvement:No   Patient / guardian was educated about steps to take if suicide or homicide risk level increases between visits. While future psychiatric events cannot be accurately predicted, the patient does not currently require acute inpatient psychiatric care and does not currently meet [facility] involuntary commitment criteria.  Subjective:  Patient presents for session 1 time.  He shared recent events where he shared a recent interaction with his ex-wife at a gymnastics event for his daughter.  He stated that prior to the event, his wife and texting them that morning, telling him that she was taking their 2 children out of state the following day.  Patient stated this was upsetting, due to it being contrary to what she would expect of him in terms of giving her notice if the situation was reversed.  He stated that he made a specific effort to avoid having contact with her during the event, however, realize she came to stand next to him following the event while they were waiting for their  children.  He stated that he got very upset, raise his voice and made many critical comments of her due to her not giving him adequate notice about wanting to take the children out of state.  He expressed some insights about how his reaction and behavior in the situation could have been handled without getting as upset.  We explore collaboratively, ways with which to cope in the situations, discussing some cognitive strategies to be intentional and increasing his awareness of thoughts and subsequent feelings that could arise prior to being around his wife or communicating with her.   He stated that they are also struggling to agree on details regarding custody arrangements with the children and they are collaborative with their attorneys.  At this point, patient stated he is planning to seek new counsel for representation.  He shares his continued struggle with having no support locally while trying to balance his schedule between work, appointments and activities for his children.  He expressed how his wife has made it challenging for him as she often will not agree with his request often throughout the mediation process.    Interventions:CBT, Solution Focused and Strength-based  Diagnosis:   ICD-10-CM   1. GAD (generalized anxiety disorder)  F41.1     Plan:   1.  Patient to continue to engage in individual counseling 2-4 times a month or as needed. 2.  Patient to identify and apply CBT, coping skills learned in session to decrease depression  and anxiety symptoms.   3. Patient to utilize effective communication skills to express his needs. 4.  Patient to identify outlets for stress management, such as working out/exercising. 5.  patient to contact this office, go to the local ED or call 911 if a crisis or emergency develops between visits.  [name], [facility]

## 2020-06-30 ENCOUNTER — Ambulatory Visit (INDEPENDENT_AMBULATORY_CARE_PROVIDER_SITE_OTHER): Payer: BC Managed Care – PPO | Admitting: Mental Health

## 2020-06-30 ENCOUNTER — Other Ambulatory Visit: Payer: Self-pay

## 2020-06-30 DIAGNOSIS — F411 Generalized anxiety disorder: Secondary | ICD-10-CM
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
## 2020-07-25 ENCOUNTER — Ambulatory Visit: Payer: BC Managed Care – PPO | Admitting: Mental Health

## 2020-08-10 ENCOUNTER — Ambulatory Visit (INDEPENDENT_AMBULATORY_CARE_PROVIDER_SITE_OTHER): Payer: BC Managed Care – PPO | Admitting: Mental Health

## 2020-08-10 ENCOUNTER — Other Ambulatory Visit: Payer: Self-pay

## 2020-08-10 DIAGNOSIS — F411 Generalized anxiety disorder: Secondary | ICD-10-CM | POA: Diagnosis not present
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
## 2020-08-24 ENCOUNTER — Other Ambulatory Visit: Payer: Self-pay

## 2020-08-24 ENCOUNTER — Ambulatory Visit (INDEPENDENT_AMBULATORY_CARE_PROVIDER_SITE_OTHER): Payer: BC Managed Care – PPO | Admitting: Mental Health

## 2020-08-24 DIAGNOSIS — F411 Generalized anxiety disorder: Secondary | ICD-10-CM

## 2020-08-24 NOTE — Progress Notes (Signed)
      [facility] Counselor/Therapist Progress Note   Patient ID: Jerome Cole, MRN: [id number]  Date: 08/24/20  Timespent: 53 mintues  Treatment Type: Individual therapy  Mental Status Exam:   Appearance:   Casual     Behavior:  Appropriate and Sharing  Motor:  Normal  Speech/Language:   Clear and Coherent  Affect:   Constricted  Mood:   Anxious, pleasant  Thought process:  Coherent and Relevant  Thought content:    Logical, linear, goal-directed  Perceptual disturbances:    none  Orientation:  Full (Time, Place, and Person)  Attention:  Good  Concentration:  good  Memory:  Intact  Fund of knowledge:   WNL  Insight:    Good  Judgment:   Good  Impulse Control:  Developing    Reported Symptoms: anxiety,rumination, irritability at times  Risk Assessment: Danger to Self:  No Self-injurious Behavior: No Danger to Others: No Duty to Warn:no Physical Aggression / Violence:No  Access to Firearms a concern: No  Gang Involvement:No   Patient / guardian was educated about steps to take if suicide or homicide risk level increases between visits. While future psychiatric events cannot be accurately predicted, the patient does not currently require acute inpatient psychiatric care and does not currently meet [facility] involuntary commitment criteria.  Subjective:  Patient presents for session sharing recent events.  Patient chair recent experiences, some continued confusion regarding communication with his wife as he shared is trying to plan schedules for their children collaboratively, where he stated his wife refuses to negotiate time frames based on on another schedules. The expresses the desire to co-parent with her effectively for their children, keeping them the focus and being mindful I'm trying to use language toward this effort. He said they're correspondence is email only. Identified I'm going stressed and anxiety related to the 50b that was taken out against him by his  wife which was dismissed but continued until today where he stated that there's a court hearing today where he hopes the charge will be fully dropped. Through guy discovery, he identified other stress related to terms about his children as they continue to adjust to the separation. He stated that his daughter had a recent panic attack and he wants her to get the support she needs which was reflected by his efforts and attempting to schedule an appointment with the family doctor for follow-up and make his ex-wife aware.   Interventions:CBT, Solution Focused and Strength-based  Diagnosis:   ICD-10-CM   1. GAD (generalized anxiety disorder)  F41.1     Plan:   1.  Patient to continue to engage in individual counseling 2-4 times a month or as needed. 2.  Patient to identify and apply CBT, coping skills learned in session to decrease depression and anxiety symptoms.   3. Patient to utilize effective communication skills to express himself when emotionally distressed. 4.  Patient to identify outlets for stress management, such as working out/exercising. 5.  patient to contact this office, go to the local ED or call 911 if a crisis or emergency develops between visits. 6.  Patient to continue with his medication management regimen with Yvette Rack, NP.  [name], [facility]

## 2020-09-08 ENCOUNTER — Ambulatory Visit (INDEPENDENT_AMBULATORY_CARE_PROVIDER_SITE_OTHER): Payer: BC Managed Care – PPO | Admitting: Mental Health

## 2020-09-08 ENCOUNTER — Other Ambulatory Visit: Payer: Self-pay

## 2020-09-08 DIAGNOSIS — F411 Generalized anxiety disorder: Secondary | ICD-10-CM

## 2020-09-08 NOTE — Progress Notes (Signed)
      [facility] Counselor/Therapist Progress Note   Patient ID: Jerome Cole, MRN: [id number]  Date: 09/08/20  Timespent: 53 mintues  Treatment Type: Individual therapy  Mental Status Exam:   Appearance:   Casual     Behavior:  Appropriate and Sharing  Motor:  Normal  Speech/Language:   Clear and Coherent  Affect:   Constricted  Mood:   Anxious, pleasant  Thought process:  Coherent and Relevant  Thought content:    Logical, linear, goal-directed  Perceptual disturbances:    none  Orientation:  Full (Time, Place, and Person)  Attention:  Good  Concentration:  good  Memory:  Intact  Fund of knowledge:   WNL  Insight:    Good  Judgment:   Good  Impulse Control:  Developing    Reported Symptoms: anxiety,rumination, irritability at times  Risk Assessment: Danger to Self:  No Self-injurious Behavior: No Danger to Others: No Duty to Warn:no Physical Aggression / Violence:No  Access to Firearms a concern: No  Gang Involvement:No   Patient / guardian was educated about steps to take if suicide or homicide risk level increases between visits. While future psychiatric events cannot be accurately predicted, the patient does not currently require acute inpatient psychiatric care and does not currently meet [facility] involuntary commitment criteria.  Subjective:  Patient presents for session sharing recent events.  He continues to focus on the life adjustment of going through the divorce process with his ex-wife.  He shared details, how he is made attempts to communicate openly and respectfully to coparent their children however, he stated his wife will often not return responses to his emails which is their primary way of communicating.  He gave many examples of how he is focused on his children, wants.  An active part of the last for filling there being successful in school, attending appointments and events in their lives.  Facilitated patient identifying these efforts and  is being cognizant of reminding himself that he is taking steps toward being effective with his communication with his ex-wife.  He shared how he also wants to be actively involved in his daughters therapy, how he was now is aware of her appointments but was able to talk to the therapist recently, attending a session.  Patient continues to care for himself day-to-day with his exercise regimen with which he stated he has continued.  Interventions:CBT, Solution Focused and Strength-based  Diagnosis:   ICD-10-CM   1. GAD (generalized anxiety disorder)  F41.1     Plan:   1.  Patient to continue to engage in individual counseling 2-4 times a month or as needed. 2.  Patient to identify and apply CBT, coping skills learned in session to decrease depression and anxiety symptoms.   3. Patient to utilize effective communication skills to express himself when emotionally distressed. 4.  Patient to identify outlets for stress management, such as working out/exercising. 5.  patient to contact this office, go to the local ED or call 911 if a crisis or emergency develops between visits. 6.  Patient to continue with his medication management regimen with Yvette Rack, NP.  [name], [facility]

## 2020-09-22 ENCOUNTER — Ambulatory Visit: Payer: BC Managed Care – PPO | Admitting: Mental Health

## 2020-09-26 ENCOUNTER — Other Ambulatory Visit: Payer: Self-pay

## 2020-09-26 ENCOUNTER — Ambulatory Visit: Payer: BC Managed Care – PPO | Admitting: Adult Health

## 2020-10-06 ENCOUNTER — Ambulatory Visit: Payer: BC Managed Care – PPO | Admitting: Mental Health

## 2020-10-26 ENCOUNTER — Ambulatory Visit: Payer: BC Managed Care – PPO | Admitting: Mental Health

## 2020-10-31 ENCOUNTER — Other Ambulatory Visit: Payer: Self-pay

## 2020-10-31 ENCOUNTER — Ambulatory Visit (INDEPENDENT_AMBULATORY_CARE_PROVIDER_SITE_OTHER): Payer: BC Managed Care – PPO | Admitting: Mental Health

## 2020-10-31 DIAGNOSIS — F411 Generalized anxiety disorder: Secondary | ICD-10-CM | POA: Diagnosis not present

## 2020-10-31 NOTE — Progress Notes (Signed)
      [facility] Counselor/Therapist Progress Note   Patient ID: Jerome Cole, MRN: [id number]  Date:  10/31/20  Timespent: 54 mintues  Treatment Type: Individual therapy  Mental Status Exam:   Appearance:   Casual     Behavior:  Appropriate and Sharing  Motor:  Normal  Speech/Language:   Clear and Coherent  Affect:   Constricted  Mood:   Anxious, pleasant  Thought process:  Coherent and Relevant  Thought content:    Logical, linear, goal-directed  Perceptual disturbances:    none  Orientation:  Full (Time, Place, and Person)  Attention:  Good  Concentration:  good  Memory:  Intact  Fund of knowledge:   WNL  Insight:    Good  Judgment:   Good  Impulse Control:  Developing    Reported Symptoms: anxiety,rumination, irritability at times  Risk Assessment: Danger to Self:  No Self-injurious Behavior: No Danger to Others: No Duty to Warn:no Physical Aggression / Violence:No  Access to Firearms a concern: No  Gang Involvement:No   Patient / guardian was educated about steps to take if suicide or homicide risk level increases between visits. While future psychiatric events cannot be accurately predicted, the patient does not currently require acute inpatient psychiatric care and does not currently meet [facility] involuntary commitment criteria.  Subjective:  Patient presents for session.  He shared the challenges of continuing to attempt to coparent with his ex-wife.  He stated that she often will not communicate back with him as their communication remains via email only.  He shared how he tries to keep the focus on the wellbeing of their children.  He continues to verbalize how he is actively involved in their lives, with various sport activities as well as needed appointments such as there continuing to go to counseling.  He shared how he has dated a woman for the past couple of months and gets along well with her children sharing some experiences.  He stated his  daughter has been apprehensive about interacting with her or her children and he respects her need in this area by being mindful to not pressure her in any way, to give her her own time as she continues to adjust.  He identified the need to continue to make efforts to communicate when needed with his ex-wife effectively with a focus on keeping the children's needs paramount.   Interventions:CBT, Solution Focused and Strength-based  Diagnosis:   ICD-10-CM   1. GAD (generalized anxiety disorder)  F41.1     Plan:   1.  Patient to continue to engage in individual counseling 2-4 times a month or as needed. 2.  Patient to identify and apply CBT, coping skills learned in session to decrease depression and anxiety symptoms.   3. Patient to utilize effective communication skills to express himself when emotionally distressed. 4.  Patient to identify outlets for stress management, such as working out/exercising. 5.  patient to contact this office, go to the local ED or call 911 if a crisis or emergency develops between visits. 6.  Patient to continue with his medication management regimen with Yvette Rack, NP.  [name], [facility]

## 2020-11-15 ENCOUNTER — Ambulatory Visit: Payer: BC Managed Care – PPO | Admitting: Mental Health

## 2021-02-08 DIAGNOSIS — R030 Elevated blood-pressure reading, without diagnosis of hypertension: Secondary | ICD-10-CM | POA: Diagnosis not present

## 2021-02-08 DIAGNOSIS — L259 Unspecified contact dermatitis, unspecified cause: Secondary | ICD-10-CM | POA: Diagnosis not present

## 2021-03-14 DIAGNOSIS — Z23 Encounter for immunization: Secondary | ICD-10-CM | POA: Diagnosis not present

## 2021-03-14 DIAGNOSIS — I10 Essential (primary) hypertension: Secondary | ICD-10-CM | POA: Diagnosis not present

## 2021-03-14 DIAGNOSIS — Z125 Encounter for screening for malignant neoplasm of prostate: Secondary | ICD-10-CM | POA: Diagnosis not present

## 2021-03-14 DIAGNOSIS — Z0000 Encounter for general adult medical examination without abnormal findings: Secondary | ICD-10-CM | POA: Diagnosis not present

## 2021-03-14 DIAGNOSIS — Z13220 Encounter for screening for lipoid disorders: Secondary | ICD-10-CM | POA: Diagnosis not present

## 2021-03-14 DIAGNOSIS — F332 Major depressive disorder, recurrent severe without psychotic features: Secondary | ICD-10-CM | POA: Diagnosis not present

## 2021-04-07 ENCOUNTER — Other Ambulatory Visit: Payer: Self-pay | Admitting: Adult Health

## 2021-04-07 DIAGNOSIS — F411 Generalized anxiety disorder: Secondary | ICD-10-CM

## 2021-04-07 DIAGNOSIS — F339 Major depressive disorder, recurrent, unspecified: Secondary | ICD-10-CM

## 2021-04-19 DIAGNOSIS — E785 Hyperlipidemia, unspecified: Secondary | ICD-10-CM | POA: Diagnosis not present

## 2021-04-19 DIAGNOSIS — B360 Pityriasis versicolor: Secondary | ICD-10-CM | POA: Diagnosis not present

## 2021-04-19 DIAGNOSIS — I10 Essential (primary) hypertension: Secondary | ICD-10-CM | POA: Diagnosis not present

## 2021-04-20 ENCOUNTER — Ambulatory Visit (INDEPENDENT_AMBULATORY_CARE_PROVIDER_SITE_OTHER): Payer: BC Managed Care – PPO | Admitting: Adult Health

## 2021-04-20 ENCOUNTER — Encounter: Payer: Self-pay | Admitting: Adult Health

## 2021-04-20 ENCOUNTER — Other Ambulatory Visit: Payer: Self-pay

## 2021-04-20 DIAGNOSIS — F339 Major depressive disorder, recurrent, unspecified: Secondary | ICD-10-CM

## 2021-04-20 DIAGNOSIS — F411 Generalized anxiety disorder: Secondary | ICD-10-CM | POA: Diagnosis not present

## 2021-04-20 MED ORDER — ESCITALOPRAM OXALATE 10 MG PO TABS: 10.0000 mg | ORAL_TABLET | Freq: Every day | ORAL | 3 refills | Status: DC

## 2021-04-20 NOTE — Progress Notes (Signed)
Jerome Cole [id number] May 31, 1971 50 y.o.  Subjective:   Patient ID:  Jerome Cole is a 50 y.o. (DOB 1970/09/28) male.  Chief Complaint: No chief complaint on file.   HPI Jerome Cole presents to the office today for follow-up of depression and anxiety.  Describes mood today as "ok". Pleasant. Mood symptoms - denies depression, anxiety, and irritability. Stating "I'm doing good". Feels like medication continues to work well for him. Children doing well. Stable interest and motivation. Taking medications as prescribed.  Focus and concentration stable. Completing tasks. Managing aspects of household. 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 Energy levels mostly stable. Active, has a regular exercise routine.  Enjoys some usual interest and activities. Divorced. Lives alone. Shares custody of 2 children - week on and a week off.  Appetite adequate. Weight gain - 172 pounds. Sleeps well most nights. Averages 7 to 8 hours. Denies SI or HI.  Denies AH or VH.   Previous medications: Zoloft  Review of Systems:  Review of Systems  Musculoskeletal:  Negative for gait problem.  Neurological:  Negative for tremors.  Psychiatric/Behavioral:         Please refer to HPI   Medications: I have reviewed the patient's current medications.  Current Outpatient Medications  Medication Sig Dispense Refill   escitalopram (LEXAPRO) 10 MG tablet Take 1 tablet (10 mg total) by mouth daily. 90 tablet 3   HYDROcodone-acetaminophen (NORCO) 5-325 MG tablet Take 1-2 tablets by mouth every 6 (six) hours as needed. (Patient not taking: Reported on 06/29/2016) 90 tablet 0   ibuprofen (ADVIL,MOTRIN) 200 MG tablet Take 200 mg by mouth every 6 (six) hours as needed.     omeprazole (PRILOSEC) 20 MG capsule Take 20 mg by mouth daily.     No current facility-administered medications for this visit.    Medication Side Effects: None  Allergies: No Known Allergies  Past Medical History:  Diagnosis  Date   Acid reflux    Medial meniscus tear 05/2015   right knee    Past Medical History, Surgical history, Social history, and Family history were reviewed and updated as appropriate.   Please see review of systems for further details on the patient's review from today.   Objective:   Physical Exam:  There were no vitals taken for this visit.  Physical Exam Constitutional:      General: He is not in acute distress. Musculoskeletal:        General: No deformity.  Neurological:     Mental Status: He is alert and oriented to person, place, and time.     Coordination: Coordination normal.  Psychiatric:        Attention and Perception: Attention and perception normal. He does not perceive auditory or visual hallucinations.        Mood and Affect: Mood normal. Mood is not anxious or depressed. Affect is not labile, blunt, angry or inappropriate.        Speech: Speech normal.        Behavior: Behavior normal.        Thought Content: Thought content normal. Thought content is not paranoid or delusional. Thought content does not include homicidal or suicidal ideation. Thought content does not include homicidal or suicidal plan.        Cognition and Memory: Cognition and memory normal.        Judgment: Judgment normal.     Comments: Insight intact    Lab Review:     Component Value Date/Time  NA 133 (L) 02/17/2012 1154   K 3.5 02/17/2012 1154   CL 95 (L) 02/17/2012 1154   CO2 28 02/17/2012 1154   GLUCOSE 107 (H) 02/17/2012 1154   BUN 10 02/17/2012 1154   CREATININE 0.94 02/17/2012 1154   CALCIUM 9.1 02/17/2012 1154   PROT 7.5 02/17/2012 1154   ALBUMIN 4.2 02/17/2012 1154   AST 32 02/17/2012 1154   ALT 49 02/17/2012 1154   ALKPHOS 104 02/17/2012 1154   BILITOT 0.8 02/17/2012 1154   GFRNONAA >90 02/17/2012 1154   GFRAA >90 02/17/2012 1154       Component Value Date/Time   WBC 11.4 (H) 02/17/2012 1154   RBC 5.02 02/17/2012 1154   HGB 15.3 02/17/2012 1154   HCT 42.0  02/17/2012 1154   PLT 197 02/17/2012 1154   MCV 83.7 02/17/2012 1154   MCH 30.5 02/17/2012 1154   MCHC 36.4 (H) 02/17/2012 1154   RDW 12.6 02/17/2012 1154   LYMPHSABS 1.0 02/17/2012 1154   MONOABS 1.2 (H) 02/17/2012 1154   EOSABS 0.0 02/17/2012 1154   BASOSABS 0.0 02/17/2012 1154    No results found for: POCLITH, LITHIUM   No results found for: PHENYTOIN, PHENOBARB, VALPROATE, CBMZ   .res Assessment: Plan:     Plan:   1. Continue Lexapro 10mg  daily  RTC 6 months  Patient advised to contact office with any questions, adverse effects, or acute worsening in signs and symptoms.   There are no diagnoses linked to this encounter.   Please see After Visit Summary for patient specific instructions.  Future Appointments  Date Time Provider Department Center  04/20/2021  8:20 AM Kaedance Magos, 06/20/2021, NP [facility] None    No orders of the defined types were placed in this encounter.   -------------------------------

## 2021-09-27 DIAGNOSIS — K219 Gastro-esophageal reflux disease without esophagitis: Secondary | ICD-10-CM | POA: Diagnosis not present

## 2021-09-27 DIAGNOSIS — R1310 Dysphagia, unspecified: Secondary | ICD-10-CM | POA: Diagnosis not present

## 2021-10-18 DIAGNOSIS — I10 Essential (primary) hypertension: Secondary | ICD-10-CM | POA: Diagnosis not present

## 2021-10-18 DIAGNOSIS — E785 Hyperlipidemia, unspecified: Secondary | ICD-10-CM | POA: Diagnosis not present

## 2021-10-19 ENCOUNTER — Encounter: Payer: Self-pay | Admitting: Adult Health

## 2021-10-19 ENCOUNTER — Ambulatory Visit (INDEPENDENT_AMBULATORY_CARE_PROVIDER_SITE_OTHER): Payer: BC Managed Care – PPO | Admitting: Adult Health

## 2021-10-19 DIAGNOSIS — F339 Major depressive disorder, recurrent, unspecified: Secondary | ICD-10-CM

## 2021-10-19 DIAGNOSIS — F411 Generalized anxiety disorder: Secondary | ICD-10-CM

## 2021-10-19 NOTE — Progress Notes (Signed)
[name] ?[id number] ?[birth year] ?51 y.o. ? ?Subjective:  ? ?Patient ID:  Jerome Cole is a 51 y.o. (DOB 1971-06-27) male. ? ?Chief Complaint: No chief complaint on file. ? ? ?HPI ?[name] presents to the office today for follow-up of depression and anxiety. ? ?Describes mood today as "ok". Pleasant. Mood symptoms - denies depression, anxiety, and irritability. Stating "I'm feel like I'm doing alright". Feels like the Lexapro at 10mg  continues to work well for him. Children doing well. Stable interest and motivation. Taking medications as prescribed.  ?Focus and concentration stable. Completing tasks. Managing aspects of household. Works full time - [employer]. ?Energy levels mostly stable. Active, has a regular exercise routine.  ?Enjoys some usual interest and activities. Divorced. Lives alone. Shares custody of 2 children - week on and a week off.  ?Appetite adequate. Weight gain - 172 to 181 pounds. ?Sleeps well most nights. Averages 7 to 8 hours. ?Denies SI or HI.  ?Denies AH or VH.  ? ?Previous medications: Zoloft ? ?Review of Systems:  ?Review of Systems  ?Musculoskeletal:  Negative for gait problem.  ?Neurological:  Negative for tremors.  ?Psychiatric/Behavioral:    ?     Please refer to HPI  ? ?Medications: I have reviewed the patient's current medications. ? ?Current Outpatient Medications  ?Medication Sig Dispense Refill  ? escitalopram (LEXAPRO) 10 MG tablet Take 1 tablet (10 mg total) by mouth daily. 90 tablet 3  ? HYDROcodone-acetaminophen (NORCO) 5-325 MG tablet Take 1-2 tablets by mouth every 6 (six) hours as needed. (Patient not taking: Reported on 06/29/2016) 90 tablet 0  ? ibuprofen (ADVIL,MOTRIN) 200 MG tablet Take 200 mg by mouth every 6 (six) hours as needed.    ? losartan (COZAAR) 25 MG tablet Take 25 mg by mouth daily.    ? omeprazole (PRILOSEC) 20 MG capsule Take 20 mg by mouth daily.    ? ?No current facility-administered medications for this visit.   ? ? ?Medication Side Effects: None ? ?Allergies: No Known Allergies ? ?Past Medical History:  ?Diagnosis Date  ? Acid reflux   ? Medial meniscus tear 05/2015  ? right knee  ? ? ?Past Medical History, Surgical history, Social history, and Family history were reviewed and updated as appropriate.  ? ?Please see review of systems for further details on the patient's review from today.  ? ?Objective:  ? ?Physical Exam:  ?There were no vitals taken for this visit. ? ?Physical Exam ?Constitutional:   ?   General: He is not in acute distress. ?Musculoskeletal:     ?   General: No deformity.  ?Neurological:  ?   Mental Status: He is alert and oriented to person, place, and time.  ?   Coordination: Coordination normal.  ?Psychiatric:     ?   Attention and Perception: Attention and perception normal. He does not perceive auditory or visual hallucinations.     ?   Mood and Affect: Mood normal. Mood is not anxious or depressed. Affect is not labile, blunt, angry or inappropriate.     ?   Speech: Speech normal.     ?   Behavior: Behavior normal.     ?   Thought Content: Thought content normal. Thought content is not paranoid or delusional. Thought content does not include homicidal or suicidal ideation. Thought content does not include homicidal or suicidal plan.     ?   Cognition and Memory: Cognition and memory normal.     ?  Judgment: Judgment normal.  ?   Comments: Insight intact  ? ? ?Lab Review:  ?   ?Component Value Date/Time  ? NA 133 (L) 02/17/2012 1154  ? K 3.5 02/17/2012 1154  ? CL 95 (L) 02/17/2012 1154  ? CO2 28 02/17/2012 1154  ? GLUCOSE 107 (H) 02/17/2012 1154  ? BUN 10 02/17/2012 1154  ? CREATININE 0.94 02/17/2012 1154  ? CALCIUM 9.1 02/17/2012 1154  ? PROT 7.5 02/17/2012 1154  ? ALBUMIN 4.2 02/17/2012 1154  ? AST 32 02/17/2012 1154  ? ALT 49 02/17/2012 1154  ? ALKPHOS 104 02/17/2012 1154  ? BILITOT 0.8 02/17/2012 1154  ? GFRNONAA >90 02/17/2012 1154  ? GFRAA >90 02/17/2012 1154  ? ? ?   ?Component Value  Date/Time  ? WBC 11.4 (H) 02/17/2012 1154  ? RBC 5.02 02/17/2012 1154  ? HGB 15.3 02/17/2012 1154  ? HCT 42.0 02/17/2012 1154  ? PLT 197 02/17/2012 1154  ? MCV 83.7 02/17/2012 1154  ? MCH 30.5 02/17/2012 1154  ? MCHC 36.4 (H) 02/17/2012 1154  ? RDW 12.6 02/17/2012 1154  ? LYMPHSABS 1.0 02/17/2012 1154  ? MONOABS 1.2 (H) 02/17/2012 1154  ? EOSABS 0.0 02/17/2012 1154  ? BASOSABS 0.0 02/17/2012 1154  ? ? ?No results found for: POCLITH, LITHIUM  ? ?No results found for: PHENYTOIN, PHENOBARB, VALPROATE, CBMZ  ? ?.res ?Assessment: Plan:   ? ?Plan:  ? ?1. Continue Lexapro 10mg  daily ? ?RTC 6 months ? ?Patient advised to contact office with any questions, adverse effects, or acute worsening in signs and symptoms. ? ?Diagnoses and all orders for this visit: ? ?GAD (generalized anxiety disorder) ? ?Episode of recurrent major depressive disorder, unspecified depression episode severity ([facility]) ? ?  ? ?Please see After Visit Summary for patient specific instructions. ? ?No future appointments. ? ?No orders of the defined types were placed in this encounter. ? ? ?------------------------------- ?

## 2021-12-19 DIAGNOSIS — K219 Gastro-esophageal reflux disease without esophagitis: Secondary | ICD-10-CM | POA: Diagnosis not present

## 2021-12-19 DIAGNOSIS — K5730 Diverticulosis of large intestine without perforation or abscess without bleeding: Secondary | ICD-10-CM | POA: Diagnosis not present

## 2021-12-19 DIAGNOSIS — K222 Esophageal obstruction: Secondary | ICD-10-CM | POA: Diagnosis not present

## 2021-12-19 DIAGNOSIS — Z1211 Encounter for screening for malignant neoplasm of colon: Secondary | ICD-10-CM | POA: Diagnosis not present

## 2021-12-19 DIAGNOSIS — K648 Other hemorrhoids: Secondary | ICD-10-CM | POA: Diagnosis not present

## 2021-12-19 DIAGNOSIS — D122 Benign neoplasm of ascending colon: Secondary | ICD-10-CM | POA: Diagnosis not present

## 2021-12-19 DIAGNOSIS — R1310 Dysphagia, unspecified: Secondary | ICD-10-CM | POA: Diagnosis not present

## 2021-12-19 DIAGNOSIS — K449 Diaphragmatic hernia without obstruction or gangrene: Secondary | ICD-10-CM | POA: Diagnosis not present

## 2021-12-25 DIAGNOSIS — L237 Allergic contact dermatitis due to plants, except food: Secondary | ICD-10-CM | POA: Diagnosis not present

## 2022-03-20 DIAGNOSIS — Z23 Encounter for immunization: Secondary | ICD-10-CM | POA: Diagnosis not present

## 2022-03-20 DIAGNOSIS — E785 Hyperlipidemia, unspecified: Secondary | ICD-10-CM | POA: Diagnosis not present

## 2022-03-20 DIAGNOSIS — H00015 Hordeolum externum left lower eyelid: Secondary | ICD-10-CM | POA: Diagnosis not present

## 2022-03-20 DIAGNOSIS — Z125 Encounter for screening for malignant neoplasm of prostate: Secondary | ICD-10-CM | POA: Diagnosis not present

## 2022-03-20 DIAGNOSIS — Z0000 Encounter for general adult medical examination without abnormal findings: Secondary | ICD-10-CM | POA: Diagnosis not present

## 2022-03-20 DIAGNOSIS — I10 Essential (primary) hypertension: Secondary | ICD-10-CM | POA: Diagnosis not present

## 2022-03-20 DIAGNOSIS — F332 Major depressive disorder, recurrent severe without psychotic features: Secondary | ICD-10-CM | POA: Diagnosis not present

## 2022-03-30 DIAGNOSIS — S61411A Laceration without foreign body of right hand, initial encounter: Secondary | ICD-10-CM | POA: Diagnosis not present

## 2022-03-30 DIAGNOSIS — S61011A Laceration without foreign body of right thumb without damage to nail, initial encounter: Secondary | ICD-10-CM | POA: Diagnosis not present

## 2022-04-15 ENCOUNTER — Other Ambulatory Visit: Payer: Self-pay | Admitting: Adult Health

## 2022-04-15 DIAGNOSIS — F339 Major depressive disorder, recurrent, unspecified: Secondary | ICD-10-CM

## 2022-04-15 DIAGNOSIS — F411 Generalized anxiety disorder: Secondary | ICD-10-CM

## 2022-04-19 ENCOUNTER — Encounter: Payer: Self-pay | Admitting: Adult Health

## 2022-04-19 ENCOUNTER — Ambulatory Visit (INDEPENDENT_AMBULATORY_CARE_PROVIDER_SITE_OTHER): Payer: BC Managed Care – PPO | Admitting: Adult Health

## 2022-04-19 DIAGNOSIS — F411 Generalized anxiety disorder: Secondary | ICD-10-CM

## 2022-04-19 DIAGNOSIS — F339 Major depressive disorder, recurrent, unspecified: Secondary | ICD-10-CM

## 2022-04-19 MED ORDER — ESCITALOPRAM OXALATE 10 MG PO TABS: 10.0000 mg | ORAL_TABLET | Freq: Every day | ORAL | 5 refills | Status: DC

## 2022-04-19 NOTE — Progress Notes (Signed)
Jerome Cole [id number] Oct 15, 1970 51 y.o.  Subjective:   Patient ID:  Jerome Cole is a 51 y.o. (DOB May 23, 1971) male.  Chief Complaint: No chief complaint on file.   HPI Jerome Cole presents to the office today for follow-up of depression and anxiety.  Describes mood today as "ok". Pleasant. Denies tearful. Mood symptoms - denies depression, anxiety and irritability. Stating "I'm doing ok". Feels like the Lexapro 10mg  continues to work well. Children doing well - shared custody. Stable interest and motivation. Taking medications as prescribed.  Focus and concentration stable. Completing tasks. Managing aspects of household. Works full time . Energy levels mostly stable. Active, has a regular exercise routine.  Enjoys some usual interest and activities. Divorced. Lives alone. Shares custody of 2 children - week on and a week off.  Appetite adequate. Weight stable - 175 pounds. Sleeps well most nights. Averages 7 to 8 hours. Denies SI or HI.  Denies AH or VH.   Previous medications: Zoloft   Review of Systems:  Review of Systems  Musculoskeletal:  Negative for gait problem.  Neurological:  Negative for tremors.  Psychiatric/Behavioral:         Please refer to HPI    Medications: I have reviewed the patient's current medications.  Current Outpatient Medications  Medication Sig Dispense Refill   escitalopram (LEXAPRO) 10 MG tablet Take 1 tablet (10 mg total) by mouth daily. 30 tablet 5   HYDROcodone-acetaminophen (NORCO) 5-325 MG tablet Take 1-2 tablets by mouth every 6 (six) hours as needed. (Patient not taking: Reported on 06/29/2016) 90 tablet 0   ibuprofen (ADVIL,MOTRIN) 200 MG tablet Take 200 mg by mouth every 6 (six) hours as needed.     losartan (COZAAR) 25 MG tablet Take 25 mg by mouth daily.     omeprazole (PRILOSEC) 20 MG capsule Take 20 mg by mouth daily.     No current facility-administered medications for this visit.    Medication Side Effects:  None  Allergies: No Known Allergies  Past Medical History:  Diagnosis Date   Acid reflux    Medial meniscus tear 05/2015   right knee    Past Medical History, Surgical history, Social history, and Family history were reviewed and updated as appropriate.   Please see review of systems for further details on the patient's review from today.   Objective:   Physical Exam:  There were no vitals taken for this visit.  Physical Exam Constitutional:      General: He is not in acute distress. Musculoskeletal:        General: No deformity.  Neurological:     Mental Status: He is alert and oriented to person, place, and time.     Coordination: Coordination normal.  Psychiatric:        Attention and Perception: Attention and perception normal. He does not perceive auditory or visual hallucinations.        Mood and Affect: Mood normal. Mood is not anxious or depressed. Affect is not labile, blunt, angry or inappropriate.        Speech: Speech normal.        Behavior: Behavior normal.        Thought Content: Thought content normal. Thought content is not paranoid or delusional. Thought content does not include homicidal or suicidal ideation. Thought content does not include homicidal or suicidal plan.        Cognition and Memory: Cognition and memory normal.        Judgment: Judgment normal.  Comments: Insight intact     Lab Review:     Component Value Date/Time   NA 133 (L) 02/17/2012 1154   K 3.5 02/17/2012 1154   CL 95 (L) 02/17/2012 1154   CO2 28 02/17/2012 1154   GLUCOSE 107 (H) 02/17/2012 1154   BUN 10 02/17/2012 1154   CREATININE 0.94 02/17/2012 1154   CALCIUM 9.1 02/17/2012 1154   PROT 7.5 02/17/2012 1154   ALBUMIN 4.2 02/17/2012 1154   AST 32 02/17/2012 1154   ALT 49 02/17/2012 1154   ALKPHOS 104 02/17/2012 1154   BILITOT 0.8 02/17/2012 1154   GFRNONAA >90 02/17/2012 1154   GFRAA >90 02/17/2012 1154       Component Value Date/Time   WBC 11.4 (H) 02/17/2012  1154   RBC 5.02 02/17/2012 1154   HGB 15.3 02/17/2012 1154   HCT 42.0 02/17/2012 1154   PLT 197 02/17/2012 1154   MCV 83.7 02/17/2012 1154   MCH 30.5 02/17/2012 1154   MCHC 36.4 (H) 02/17/2012 1154   RDW 12.6 02/17/2012 1154   LYMPHSABS 1.0 02/17/2012 1154   MONOABS 1.2 (H) 02/17/2012 1154   EOSABS 0.0 02/17/2012 1154   BASOSABS 0.0 02/17/2012 1154    No results found for: "POCLITH", "LITHIUM"   No results found for: "PHENYTOIN", "PHENOBARB", "VALPROATE", "CBMZ"   .res Assessment: Plan:    Plan:   1. Continue Lexapro 10mg  daily  RTC 6 months   Time spent with patient was 20 minutes. Greater than 50% of face to face time with patient was spent on counseling and coordination of care.    Patient advised to contact office with any questions, adverse effects, or acute worsening in signs and symptoms.  Diagnoses and all orders for this visit:  GAD (generalized anxiety disorder) -     escitalopram (LEXAPRO) 10 MG tablet; Take 1 tablet (10 mg total) by mouth daily.  Episode of recurrent major depressive disorder, unspecified depression episode severity ([place]) -     escitalopram (LEXAPRO) 10 MG tablet; Take 1 tablet (10 mg total) by mouth daily.     Please see After Visit Summary for patient specific instructions.  No future appointments.  No orders of the defined types were placed in this encounter.   -------------------------------

## 2022-05-16 DIAGNOSIS — Z23 Encounter for immunization: Secondary | ICD-10-CM | POA: Diagnosis not present

## 2022-07-27 DIAGNOSIS — F4323 Adjustment disorder with mixed anxiety and depressed mood: Secondary | ICD-10-CM | POA: Diagnosis not present

## 2022-08-10 DIAGNOSIS — F4323 Adjustment disorder with mixed anxiety and depressed mood: Secondary | ICD-10-CM | POA: Diagnosis not present

## 2022-08-24 DIAGNOSIS — F4323 Adjustment disorder with mixed anxiety and depressed mood: Secondary | ICD-10-CM | POA: Diagnosis not present

## 2022-09-07 DIAGNOSIS — F4323 Adjustment disorder with mixed anxiety and depressed mood: Secondary | ICD-10-CM | POA: Diagnosis not present

## 2022-10-13 DIAGNOSIS — F4322 Adjustment disorder with anxiety: Secondary | ICD-10-CM | POA: Diagnosis not present

## 2022-10-17 DIAGNOSIS — I10 Essential (primary) hypertension: Secondary | ICD-10-CM | POA: Diagnosis not present

## 2022-10-17 DIAGNOSIS — F332 Major depressive disorder, recurrent severe without psychotic features: Secondary | ICD-10-CM | POA: Diagnosis not present

## 2022-10-17 DIAGNOSIS — E785 Hyperlipidemia, unspecified: Secondary | ICD-10-CM | POA: Diagnosis not present

## 2022-10-17 DIAGNOSIS — K219 Gastro-esophageal reflux disease without esophagitis: Secondary | ICD-10-CM | POA: Diagnosis not present

## 2022-10-19 ENCOUNTER — Encounter: Payer: Self-pay | Admitting: Adult Health

## 2022-10-19 ENCOUNTER — Ambulatory Visit (INDEPENDENT_AMBULATORY_CARE_PROVIDER_SITE_OTHER): Payer: BC Managed Care – PPO | Admitting: Adult Health

## 2022-10-19 DIAGNOSIS — F339 Major depressive disorder, recurrent, unspecified: Secondary | ICD-10-CM | POA: Diagnosis not present

## 2022-10-19 DIAGNOSIS — F411 Generalized anxiety disorder: Secondary | ICD-10-CM | POA: Diagnosis not present

## 2022-10-19 MED ORDER — ESCITALOPRAM OXALATE 10 MG PO TABS: 10.0000 mg | ORAL_TABLET | Freq: Every day | ORAL | 1 refills | Status: DC

## 2022-10-19 NOTE — Progress Notes (Signed)
Cayde Masley [id number] 11-Feb-1971 52 y.o.  Subjective:   Patient ID:  Jerome Cole is a 52 y.o. (DOB 19-Jan-1971) male.  Chief Complaint: No chief complaint on file.   HPI Jerome Cole presents to the office today for follow-up of depression and anxiety.  Describes mood today as "ok". Pleasant. Denies tearful. Mood symptoms - denies depression, anxiety and irritability. Mood is consistent. Stating "I'm doing alright". Feels like the Lexapro 10mg  continues to work well. Children doing well - shared custody. Stable interest and motivation. Taking medications as prescribed.  Focus and concentration stable. Completing tasks. Managing aspects of household. Works full time Scientist, research (physical sciences). Energy levels mostly stable. Active, has a regular exercise routine.  Enjoys some usual interest and activities. Divorced. Lives alone. Shares custody of 2 children - week on and a week off.  Appetite adequate. Weight gain - 175 to 180 pounds. Sleeps well most nights. Averages 7 to 8 hours. Denies SI or HI.  Denies AH or VH.   Therapist - Avie Echevaria  Previous medications: Zoloft  Review of Systems:  Review of Systems  Musculoskeletal:  Negative for gait problem.  Neurological:  Negative for tremors.  Psychiatric/Behavioral:         Please refer to HPI    Medications: I have reviewed the patient's current medications.  Current Outpatient Medications  Medication Sig Dispense Refill   escitalopram (LEXAPRO) 10 MG tablet Take 1 tablet (10 mg total) by mouth daily. 30 tablet 5   HYDROcodone-acetaminophen (NORCO) 5-325 MG tablet Take 1-2 tablets by mouth every 6 (six) hours as needed. (Patient not taking: Reported on 06/29/2016) 90 tablet 0   ibuprofen (ADVIL,MOTRIN) 200 MG tablet Take 200 mg by mouth every 6 (six) hours as needed.     losartan (COZAAR) 25 MG tablet Take 25 mg by mouth daily.     omeprazole (PRILOSEC) 20 MG capsule Take 20 mg by mouth daily.     No current facility-administered  medications for this visit.    Medication Side Effects: None  Allergies: No Known Allergies  Past Medical History:  Diagnosis Date   Acid reflux    Medial meniscus tear 05/2015   right knee    Past Medical History, Surgical history, Social history, and Family history were reviewed and updated as appropriate.   Please see review of systems for further details on the patient's review from today.   Objective:   Physical Exam:  There were no vitals taken for this visit.  Physical Exam Constitutional:      General: He is not in acute distress. Musculoskeletal:        General: No deformity.  Neurological:     Mental Status: He is alert and oriented to person, place, and time.     Coordination: Coordination normal.  Psychiatric:        Attention and Perception: Attention and perception normal. He does not perceive auditory or visual hallucinations.        Mood and Affect: Mood normal. Mood is not anxious or depressed. Affect is not labile, blunt, angry or inappropriate.        Speech: Speech normal.        Behavior: Behavior normal.        Thought Content: Thought content normal. Thought content is not paranoid or delusional. Thought content does not include homicidal or suicidal ideation. Thought content does not include homicidal or suicidal plan.        Cognition and Memory: Cognition and memory normal.  Judgment: Judgment normal.     Comments: Insight intact     Lab Review:     Component Value Date/Time   NA 133 (L) 02/17/2012 1154   K 3.5 02/17/2012 1154   CL 95 (L) 02/17/2012 1154   CO2 28 02/17/2012 1154   GLUCOSE 107 (H) 02/17/2012 1154   BUN 10 02/17/2012 1154   CREATININE 0.94 02/17/2012 1154   CALCIUM 9.1 02/17/2012 1154   PROT 7.5 02/17/2012 1154   ALBUMIN 4.2 02/17/2012 1154   AST 32 02/17/2012 1154   ALT 49 02/17/2012 1154   ALKPHOS 104 02/17/2012 1154   BILITOT 0.8 02/17/2012 1154   GFRNONAA >90 02/17/2012 1154   GFRAA >90 02/17/2012 1154        Component Value Date/Time   WBC 11.4 (H) 02/17/2012 1154   RBC 5.02 02/17/2012 1154   HGB 15.3 02/17/2012 1154   HCT 42.0 02/17/2012 1154   PLT 197 02/17/2012 1154   MCV 83.7 02/17/2012 1154   MCH 30.5 02/17/2012 1154   MCHC 36.4 (H) 02/17/2012 1154   RDW 12.6 02/17/2012 1154   LYMPHSABS 1.0 02/17/2012 1154   MONOABS 1.2 (H) 02/17/2012 1154   EOSABS 0.0 02/17/2012 1154   BASOSABS 0.0 02/17/2012 1154    No results found for: "POCLITH", "LITHIUM"   No results found for: "PHENYTOIN", "PHENOBARB", "VALPROATE", "CBMZ"   .res Assessment: Plan:    Plan:   1. Continue Lexapro 10mg  daily  RTC 6 months  Time spent with patient was 20 minutes. Greater than 50% of face to face time with patient was spent on counseling and coordination of care.    Patient advised to contact office with any questions, adverse effects, or acute worsening in signs and symptoms.  There are no diagnoses linked to this encounter.   Please see After Visit Summary for patient specific instructions.  Future Appointments  Date Time Provider Department Center  10/19/2022  8:20 AM Jemiah Ellenburg, Thereasa Soloegina Nattalie, NP [facility] None    No orders of the defined types were placed in this encounter.   -------------------------------

## 2022-11-15 DIAGNOSIS — F4322 Adjustment disorder with anxiety: Secondary | ICD-10-CM | POA: Diagnosis not present

## 2022-12-08 DIAGNOSIS — F4322 Adjustment disorder with anxiety: Secondary | ICD-10-CM | POA: Diagnosis not present

## 2022-12-29 DIAGNOSIS — F4322 Adjustment disorder with anxiety: Secondary | ICD-10-CM | POA: Diagnosis not present

## 2023-01-19 DIAGNOSIS — F4322 Adjustment disorder with anxiety: Secondary | ICD-10-CM | POA: Diagnosis not present

## 2023-02-02 DIAGNOSIS — F4322 Adjustment disorder with anxiety: Secondary | ICD-10-CM | POA: Diagnosis not present

## 2023-03-02 DIAGNOSIS — F4322 Adjustment disorder with anxiety: Secondary | ICD-10-CM | POA: Diagnosis not present

## 2023-03-16 DIAGNOSIS — F4322 Adjustment disorder with anxiety: Secondary | ICD-10-CM | POA: Diagnosis not present

## 2023-04-13 DIAGNOSIS — F4322 Adjustment disorder with anxiety: Secondary | ICD-10-CM | POA: Diagnosis not present

## 2023-04-19 ENCOUNTER — Encounter: Payer: Self-pay | Admitting: Adult Health

## 2023-04-19 ENCOUNTER — Ambulatory Visit (INDEPENDENT_AMBULATORY_CARE_PROVIDER_SITE_OTHER): Payer: BC Managed Care – PPO | Admitting: Adult Health

## 2023-04-19 DIAGNOSIS — F339 Major depressive disorder, recurrent, unspecified: Secondary | ICD-10-CM

## 2023-04-19 DIAGNOSIS — F411 Generalized anxiety disorder: Secondary | ICD-10-CM | POA: Diagnosis not present

## 2023-04-19 MED ORDER — ESCITALOPRAM OXALATE 10 MG PO TABS: 10.0000 mg | ORAL_TABLET | Freq: Every day | ORAL | 1 refills | Status: DC

## 2023-04-19 NOTE — Progress Notes (Signed)
Jerome Cole [id number] 05-06-1971 52 y.o.  Subjective:   Patient ID:  Jerome Cole is a 52 y.o. (DOB 06/16/1971) male.  Chief Complaint: No chief complaint on file.   HPI Jerome Cole presents to the office today for follow-up of depression and anxiety.  Describes mood today as "ok". Pleasant. Denies tearful. Mood symptoms - denies depression, anxiety and irritability. Denies panic attacks. Denies worry, rumination and over thinking. Mood is consistent. Stating "I feel like I'm doing alright". Feels like the Lexapro 10mg  continues to work well. Children doing well - shared custody. Stable interest and motivation. Taking medications as prescribed.  Focus and concentration stable. Completing tasks. Managing aspects of household. Works full time Scientist, research (physical sciences). Energy levels mostly stable. Active, does not have a regular exercise routine.  Enjoys some usual interest and activities. Divorced. Lives alone. Shares custody of 2 children - week on and a week off.  Appetite adequate. Weight gain - 175 to 180 pounds. Sleeps well most nights. Averages 7 to 8 hours. Denies SI or HI.  Denies AH or VH.  Denies self harm. Denies substance use.  Therapist - Avie Echevaria  Previous medications: Zoloft      Review of Systems:  Review of Systems  Musculoskeletal:  Negative for gait problem.  Neurological:  Negative for tremors.  Psychiatric/Behavioral:         Please refer to HPI    Medications: I have reviewed the patient's current medications.  Current Outpatient Medications  Medication Sig Dispense Refill   escitalopram (LEXAPRO) 10 MG tablet Take 1 tablet (10 mg total) by mouth daily. 90 tablet 1   HYDROcodone-acetaminophen (NORCO) 5-325 MG tablet Take 1-2 tablets by mouth every 6 (six) hours as needed. (Patient not taking: Reported on 06/29/2016) 90 tablet 0   ibuprofen (ADVIL,MOTRIN) 200 MG tablet Take 200 mg by mouth every 6 (six) hours as needed.     losartan (COZAAR) 25 MG  tablet Take 25 mg by mouth daily.     omeprazole (PRILOSEC) 20 MG capsule Take 20 mg by mouth daily.     No current facility-administered medications for this visit.    Medication Side Effects: None  Allergies: No Known Allergies  Past Medical History:  Diagnosis Date   Acid reflux    Medial meniscus tear 05/2015   right knee    Past Medical History, Surgical history, Social history, and Family history were reviewed and updated as appropriate.   Please see review of systems for further details on the patient's review from today.   Objective:   Physical Exam:  There were no vitals taken for this visit.  Physical Exam Constitutional:      General: He is not in acute distress. Musculoskeletal:        General: No deformity.  Neurological:     Mental Status: He is alert and oriented to person, place, and time.     Coordination: Coordination normal.  Psychiatric:        Attention and Perception: Attention and perception normal. He does not perceive auditory or visual hallucinations.        Mood and Affect: Mood normal. Mood is not anxious or depressed. Affect is not labile, blunt, angry or inappropriate.        Speech: Speech normal.        Behavior: Behavior normal.        Thought Content: Thought content normal. Thought content is not paranoid or delusional. Thought content does not include homicidal or suicidal ideation. Thought  content does not include homicidal or suicidal plan.        Cognition and Memory: Cognition and memory normal.        Judgment: Judgment normal.     Comments: Insight intact     Lab Review:     Component Value Date/Time   NA 133 (L) 02/17/2012 1154   K 3.5 02/17/2012 1154   CL 95 (L) 02/17/2012 1154   CO2 28 02/17/2012 1154   GLUCOSE 107 (H) 02/17/2012 1154   BUN 10 02/17/2012 1154   CREATININE 0.94 02/17/2012 1154   CALCIUM 9.1 02/17/2012 1154   PROT 7.5 02/17/2012 1154   ALBUMIN 4.2 02/17/2012 1154   AST 32 02/17/2012 1154   ALT 49  02/17/2012 1154   ALKPHOS 104 02/17/2012 1154   BILITOT 0.8 02/17/2012 1154   GFRNONAA >90 02/17/2012 1154   GFRAA >90 02/17/2012 1154       Component Value Date/Time   WBC 11.4 (H) 02/17/2012 1154   RBC 5.02 02/17/2012 1154   HGB 15.3 02/17/2012 1154   HCT 42.0 02/17/2012 1154   PLT 197 02/17/2012 1154   MCV 83.7 02/17/2012 1154   MCH 30.5 02/17/2012 1154   MCHC 36.4 (H) 02/17/2012 1154   RDW 12.6 02/17/2012 1154   LYMPHSABS 1.0 02/17/2012 1154   MONOABS 1.2 (H) 02/17/2012 1154   EOSABS 0.0 02/17/2012 1154   BASOSABS 0.0 02/17/2012 1154    No results found for: "POCLITH", "LITHIUM"   No results found for: "PHENYTOIN", "PHENOBARB", "VALPROATE", "CBMZ"   .res Assessment: Plan:    Plan:   1. Continue Lexapro 10mg  daily  RTC 6 months  Time spent with patient was 20 minutes. Greater than 50% of face to face time with patient was spent on counseling and coordination of care.    Patient advised to contact office with any questions, adverse effects, or acute worsening in signs and symptoms. There are no diagnoses linked to this encounter.   Please see After Visit Summary for patient specific instructions.  Future Appointments  Date Time Provider Department Center  04/19/2023  9:00 AM Markanthony Gedney, Thereasa Solo, NP [facility] None    No orders of the defined types were placed in this encounter.   -------------------------------

## 2023-04-27 DIAGNOSIS — F4322 Adjustment disorder with anxiety: Secondary | ICD-10-CM | POA: Diagnosis not present

## 2023-05-11 DIAGNOSIS — F4322 Adjustment disorder with anxiety: Secondary | ICD-10-CM | POA: Diagnosis not present

## 2023-05-25 DIAGNOSIS — F4322 Adjustment disorder with anxiety: Secondary | ICD-10-CM | POA: Diagnosis not present

## 2023-05-29 ENCOUNTER — Encounter: Payer: Self-pay | Admitting: Psychiatry

## 2023-06-08 DIAGNOSIS — F4322 Adjustment disorder with anxiety: Secondary | ICD-10-CM | POA: Diagnosis not present

## 2023-06-22 DIAGNOSIS — F4322 Adjustment disorder with anxiety: Secondary | ICD-10-CM | POA: Diagnosis not present

## 2023-07-06 DIAGNOSIS — F4322 Adjustment disorder with anxiety: Secondary | ICD-10-CM | POA: Diagnosis not present

## 2023-07-20 DIAGNOSIS — F4322 Adjustment disorder with anxiety: Secondary | ICD-10-CM | POA: Diagnosis not present

## 2023-08-03 DIAGNOSIS — F4322 Adjustment disorder with anxiety: Secondary | ICD-10-CM | POA: Diagnosis not present

## 2023-08-17 DIAGNOSIS — F4322 Adjustment disorder with anxiety: Secondary | ICD-10-CM | POA: Diagnosis not present

## 2023-08-31 DIAGNOSIS — F4322 Adjustment disorder with anxiety: Secondary | ICD-10-CM | POA: Diagnosis not present

## 2023-09-14 DIAGNOSIS — F4322 Adjustment disorder with anxiety: Secondary | ICD-10-CM | POA: Diagnosis not present

## 2023-09-28 DIAGNOSIS — F4322 Adjustment disorder with anxiety: Secondary | ICD-10-CM | POA: Diagnosis not present

## 2023-10-12 DIAGNOSIS — F4322 Adjustment disorder with anxiety: Secondary | ICD-10-CM | POA: Diagnosis not present

## 2023-10-18 ENCOUNTER — Encounter: Payer: Self-pay | Admitting: Adult Health

## 2023-10-18 ENCOUNTER — Ambulatory Visit: Payer: BC Managed Care – PPO | Admitting: Adult Health

## 2023-10-18 DIAGNOSIS — F411 Generalized anxiety disorder: Secondary | ICD-10-CM | POA: Diagnosis not present

## 2023-10-18 MED ORDER — ESCITALOPRAM OXALATE 10 MG PO TABS: 10.0000 mg | ORAL_TABLET | Freq: Every day | ORAL | 1 refills | Status: DC

## 2023-10-18 NOTE — Progress Notes (Signed)
 Jerome Cole [id number] 20-May-1971 53 y.o.  Virtual Visit via Telephone Note  I connected with pt on 10/18/23 at  9:00 AM EDT by telephone and verified that I am speaking with the correct person using two identifiers.   I discussed the limitations, risks, security and privacy concerns of performing an evaluation and management service by telephone and the availability of in person appointments. I also discussed with the patient that there may be a patient responsible charge related to this service. The patient expressed understanding and agreed to proceed.   I discussed the assessment and treatment plan with the patient. The patient was provided an opportunity to ask questions and all were answered. The patient agreed with the plan and demonstrated an understanding of the instructions.   The patient was advised to call back or seek an in-person evaluation if the symptoms worsen or if the condition fails to improve as anticipated.  I provided 20 minutes of non-face-to-face time during this encounter.  The patient was located at home.  The provider was located at [facility].   Dorothyann Gibbs, NP   Subjective:   Patient ID:  Jerome Cole is a 53 y.o. (DOB 12/19/1970) male.  Chief Complaint: No chief complaint on file.   HPI Jerome Cole presents for follow-up of depression and anxiety.  Describes mood today as "ok". Pleasant. Denies tearful. Mood symptoms - denies depression, anxiety and irritability. Reports stable interest and motivation. Denies panic attacks. Denies worry, rumination and over thinking. Mood is consistent. Stating "I feel like I'm doing pretty good". Feels like the Lexapro 10mg  continues to work well. Taking medications as prescribed.  Focus and concentration stable. Completing tasks. Managing aspects of household. Works full time Scientist, research (physical sciences). Energy levels mostly stable. Active, does not have a regular exercise routine - rotator cuff.  Enjoys some  usual interest and activities. Divorced. Lives alone. Shares custody of 2 children. Appetite adequate. Weight stable - 175 to 180 pounds. Sleeps well most nights. Averages 7 to 8 hours. Denies SI or HI.  Denies AH or VH.  Denies self harm. Denies substance use.  Therapist - Avie Echevaria  Previous medications: Zoloft   Review of Systems:  Review of Systems  Musculoskeletal:  Negative for gait problem.  Neurological:  Negative for tremors.  Psychiatric/Behavioral:         Please refer to HPI    Medications: I have reviewed the patient's current medications.  Current Outpatient Medications  Medication Sig Dispense Refill   escitalopram (LEXAPRO) 10 MG tablet Take 1 tablet (10 mg total) by mouth daily. 90 tablet 1   HYDROcodone-acetaminophen (NORCO) 5-325 MG tablet Take 1-2 tablets by mouth every 6 (six) hours as needed. (Patient not taking: Reported on 06/29/2016) 90 tablet 0   ibuprofen (ADVIL,MOTRIN) 200 MG tablet Take 200 mg by mouth every 6 (six) hours as needed.     losartan (COZAAR) 25 MG tablet Take 25 mg by mouth daily.     omeprazole (PRILOSEC) 20 MG capsule Take 20 mg by mouth daily.     No current facility-administered medications for this visit.    Medication Side Effects: None  Allergies: No Known Allergies  Past Medical History:  Diagnosis Date   Acid reflux    Medial meniscus tear 05/2015   right knee    No family history on file.  Social History   Socioeconomic History   Marital status: Divorced    Spouse name: Not on file   Number of children: Not  on file   Years of education: Not on file   Highest education level: Not on file  Occupational History   Not on file  Tobacco Use   Smoking status: Never   Smokeless tobacco: Never  Substance and Sexual Activity   Alcohol use: Yes    Comment: occasionally   Drug use: No   Sexual activity: Not on file  Other Topics Concern   Not on file  Social History Narrative   Not on file   Social  Drivers of Health   Financial Resource Strain: Not on file  Food Insecurity: Not on file  Transportation Needs: Not on file  Physical Activity: Not on file  Stress: Not on file  Social Connections: Unknown (11/26/2021)   Received from [facility], [facility]   Social Network    Social Network: Not on file  Intimate Partner Violence: Unknown (10/19/2021)   Received from [facility], [facility]   HITS    Physically Hurt: Not on file    Insult or Talk Down To: Not on file    Threaten Physical Harm: Not on file    Scream or Curse: Not on file    Past Medical History, Surgical history, Social history, and Family history were reviewed and updated as appropriate.   Please see review of systems for further details on the patient's review from today.   Objective:   Physical Exam:  There were no vitals taken for this visit.  Physical Exam Constitutional:      General: He is not in acute distress. Musculoskeletal:        General: No deformity.  Neurological:     Mental Status: He is alert and oriented to person, place, and time.     Coordination: Coordination normal.  Psychiatric:        Attention and Perception: Attention and perception normal. He does not perceive auditory or visual hallucinations.        Mood and Affect: Affect is not labile, blunt, angry or inappropriate.        Speech: Speech normal.        Behavior: Behavior normal.        Thought Content: Thought content normal. Thought content is not paranoid or delusional. Thought content does not include homicidal or suicidal ideation. Thought content does not include homicidal or suicidal plan.        Cognition and Memory: Cognition and memory normal.        Judgment: Judgment normal.     Comments: Insight intact     Lab Review:     Component Value Date/Time   NA 133 (L) 02/17/2012 1154   K 3.5 02/17/2012 1154   CL 95 (L) 02/17/2012 1154   CO2 28 02/17/2012 1154   GLUCOSE 107 (H) 02/17/2012 1154   BUN  10 02/17/2012 1154   CREATININE 0.94 02/17/2012 1154   CALCIUM 9.1 02/17/2012 1154   PROT 7.5 02/17/2012 1154   ALBUMIN 4.2 02/17/2012 1154   AST 32 02/17/2012 1154   ALT 49 02/17/2012 1154   ALKPHOS 104 02/17/2012 1154   BILITOT 0.8 02/17/2012 1154   GFRNONAA >90 02/17/2012 1154   GFRAA >90 02/17/2012 1154       Component Value Date/Time   WBC 11.4 (H) 02/17/2012 1154   RBC 5.02 02/17/2012 1154   HGB 15.3 02/17/2012 1154   HCT 42.0 02/17/2012 1154   PLT 197 02/17/2012 1154   MCV 83.7 02/17/2012 1154   MCH 30.5 02/17/2012 1154  MCHC 36.4 (H) 02/17/2012 1154   RDW 12.6 02/17/2012 1154   LYMPHSABS 1.0 02/17/2012 1154   MONOABS 1.2 (H) 02/17/2012 1154   EOSABS 0.0 02/17/2012 1154   BASOSABS 0.0 02/17/2012 1154    No results found for: "POCLITH", "LITHIUM"   No results found for: "PHENYTOIN", "PHENOBARB", "VALPROATE", "CBMZ"   .res Assessment: Plan:    Plan:   1. Continue Lexapro 10mg  daily  Working with counselor bi-weekly.  RTC 6 months  20 minutes spent dedicated to the care of this patient on the date of this encounter to include pre-visit review of records, ordering of medication, post visit documentation, and face-to-face time with the patient discussing GAD. Discussed continuing current medication regimen.  Patient advised to contact office with any questions, adverse effects, or acute worsening in signs and symptoms.  There are no diagnoses linked to this encounter.   Please see After Visit Summary for patient specific instructions.  Future Appointments  Date Time Provider Department Center  10/18/2023  9:00 AM Aymar Whitfill, Thereasa Solo, NP [facility] None    No orders of the defined types were placed in this encounter.     -------------------------------

## 2023-10-24 DIAGNOSIS — M25511 Pain in right shoulder: Secondary | ICD-10-CM | POA: Diagnosis not present

## 2023-11-09 DIAGNOSIS — F4320 Adjustment disorder, unspecified: Secondary | ICD-10-CM | POA: Diagnosis not present

## 2023-11-16 DIAGNOSIS — F4320 Adjustment disorder, unspecified: Secondary | ICD-10-CM | POA: Diagnosis not present

## 2023-12-13 DIAGNOSIS — Z0000 Encounter for general adult medical examination without abnormal findings: Secondary | ICD-10-CM | POA: Diagnosis not present

## 2023-12-13 DIAGNOSIS — Z7251 High risk heterosexual behavior: Secondary | ICD-10-CM | POA: Diagnosis not present

## 2023-12-13 DIAGNOSIS — I10 Essential (primary) hypertension: Secondary | ICD-10-CM | POA: Diagnosis not present

## 2023-12-13 DIAGNOSIS — Z125 Encounter for screening for malignant neoplasm of prostate: Secondary | ICD-10-CM | POA: Diagnosis not present

## 2023-12-13 DIAGNOSIS — F332 Major depressive disorder, recurrent severe without psychotic features: Secondary | ICD-10-CM | POA: Diagnosis not present

## 2023-12-13 DIAGNOSIS — E785 Hyperlipidemia, unspecified: Secondary | ICD-10-CM | POA: Diagnosis not present

## 2024-01-18 DIAGNOSIS — F4320 Adjustment disorder, unspecified: Secondary | ICD-10-CM | POA: Diagnosis not present

## 2024-02-01 DIAGNOSIS — F4320 Adjustment disorder, unspecified: Secondary | ICD-10-CM | POA: Diagnosis not present

## 2024-02-18 DIAGNOSIS — L578 Other skin changes due to chronic exposure to nonionizing radiation: Secondary | ICD-10-CM | POA: Diagnosis not present

## 2024-02-18 DIAGNOSIS — L821 Other seborrheic keratosis: Secondary | ICD-10-CM | POA: Diagnosis not present

## 2024-02-18 DIAGNOSIS — D485 Neoplasm of uncertain behavior of skin: Secondary | ICD-10-CM | POA: Diagnosis not present

## 2024-02-29 DIAGNOSIS — F4320 Adjustment disorder, unspecified: Secondary | ICD-10-CM | POA: Diagnosis not present

## 2024-03-05 DIAGNOSIS — L255 Unspecified contact dermatitis due to plants, except food: Secondary | ICD-10-CM | POA: Diagnosis not present

## 2024-03-06 DIAGNOSIS — I10 Essential (primary) hypertension: Secondary | ICD-10-CM | POA: Diagnosis not present

## 2024-03-06 DIAGNOSIS — D164 Benign neoplasm of bones of skull and face: Secondary | ICD-10-CM | POA: Diagnosis not present

## 2024-03-06 DIAGNOSIS — M952 Other acquired deformity of head: Secondary | ICD-10-CM | POA: Diagnosis not present

## 2024-03-19 DIAGNOSIS — D164 Benign neoplasm of bones of skull and face: Secondary | ICD-10-CM | POA: Diagnosis not present

## 2024-04-11 DIAGNOSIS — F4320 Adjustment disorder, unspecified: Secondary | ICD-10-CM | POA: Diagnosis not present

## 2024-04-16 DIAGNOSIS — M952 Other acquired deformity of head: Secondary | ICD-10-CM | POA: Diagnosis not present

## 2024-04-16 DIAGNOSIS — D164 Benign neoplasm of bones of skull and face: Secondary | ICD-10-CM | POA: Diagnosis not present

## 2024-05-23 DIAGNOSIS — F4320 Adjustment disorder, unspecified: Secondary | ICD-10-CM | POA: Diagnosis not present

## 2024-05-30 ENCOUNTER — Other Ambulatory Visit: Payer: Self-pay | Admitting: Adult Health

## 2024-05-30 DIAGNOSIS — F411 Generalized anxiety disorder: Secondary | ICD-10-CM

## 2024-06-06 DIAGNOSIS — F4320 Adjustment disorder, unspecified: Secondary | ICD-10-CM | POA: Diagnosis not present

## 2024-06-20 DIAGNOSIS — F4320 Adjustment disorder, unspecified: Secondary | ICD-10-CM | POA: Diagnosis not present

## 2024-06-26 DIAGNOSIS — Z23 Encounter for immunization: Secondary | ICD-10-CM | POA: Diagnosis not present

## 2024-08-05 ENCOUNTER — Encounter: Payer: Self-pay | Admitting: Adult Health

## 2024-08-05 ENCOUNTER — Telehealth: Admitting: Adult Health

## 2024-08-05 DIAGNOSIS — F411 Generalized anxiety disorder: Secondary | ICD-10-CM

## 2024-08-05 MED ORDER — ESCITALOPRAM OXALATE 10 MG PO TABS: 10.0000 mg | ORAL_TABLET | Freq: Every day | ORAL | 1 refills | Status: AC

## 2024-08-05 NOTE — Progress Notes (Signed)
 Jerome Cole [id number] 1970/11/19 54 y.o.  Virtual Visit via Video Note  I connected with pt @ on 08/05/24 at  9:30 AM EST by a video enabled telemedicine application and verified that I am speaking with the correct person using two identifiers.   I discussed the limitations of evaluation and management by telemedicine and the availability of in person appointments. The patient expressed understanding and agreed to proceed.  I discussed the assessment and treatment plan with the patient. The patient was provided an opportunity to ask questions and all were answered. The patient agreed with the plan and demonstrated an understanding of the instructions.   The patient was advised to call back or seek an in-person evaluation if the symptoms worsen or if the condition fails to improve as anticipated.  I provided 10 minutes of non-face-to-face time during this encounter.  The patient was located at home.  The provider was located at [facility].   Jerome LOISE Sayers, NP   Subjective:   Patient ID:  Jerome Cole is a 54 y.o. (DOB [birth year]) male.  Chief Complaint: No chief complaint on file.   HPI Jerome Cole presents for follow-up of depression and anxiety.  Describes mood today as ok. Pleasant. Denies tearful. Mood symptoms - denies depression, anxiety and irritability. Reports stable interest and motivation. Denies panic attacks. Denies worry, rumination and over thinking. Reports mood is consistent. Stating I feel like I'm doing ok. Feels like the Lexapro  10mg  continues to work well. Taking medications as prescribed.  Focus and concentration stable. Completing tasks. Managing aspects of household. Works full time Scientist, Research (physical Sciences). Energy levels mostly stable. Active, does not have a regular exercise routine. Enjoys some usual interest and activities. Divorced. Lives alone. Shares custody of 2 children - 15 and 18. Appetite adequate. Weight stable - 180 pounds. Sleeps  well most nights. Averages 7 to 8 hours. Denies SI or HI.  Denies AH or VH.  Denies self harm. Denies substance use.  Therapist - Jerome Cole  Previous medications: Zoloft   Review of Systems:  Review of Systems  Musculoskeletal:  Negative for gait problem.  Neurological:  Negative for tremors.  Psychiatric/Behavioral:         Please refer to HPI    Medications: I have reviewed the patient's current medications.  Current Outpatient Medications  Medication Sig Dispense Refill   escitalopram  (LEXAPRO ) 10 MG tablet Take 1 tablet (10 mg total) by mouth daily. 90 tablet 1   HYDROcodone -acetaminophen  (NORCO) 5-325 MG tablet Take 1-2 tablets by mouth every 6 (six) hours as needed. (Patient not taking: Reported on 06/29/2016) 90 tablet 0   ibuprofen (ADVIL,MOTRIN) 200 MG tablet Take 200 mg by mouth every 6 (six) hours as needed.     losartan (COZAAR) 25 MG tablet Take 25 mg by mouth daily.     omeprazole (PRILOSEC) 20 MG capsule Take 20 mg by mouth daily.     No current facility-administered medications for this visit.    Medication Side Effects: None  Allergies: Allergies[1]  Past Medical History:  Diagnosis Date   Acid reflux    Medial meniscus tear 05/2015   right knee    No family history on file.  Social History   Socioeconomic History   Marital status: Divorced    Spouse name: Not on file   Number of children: Not on file   Years of education: Not on file   Highest education level: Not on file  Occupational History   Not on file  Tobacco Use   Smoking status: Never   Smokeless tobacco: Never  Substance and Sexual Activity   Alcohol use: Yes    Comment: occasionally   Drug use: No   Sexual activity: Not on file  Other Topics Concern   Not on file  Social History Narrative   Not on file   Social Drivers of Health   Tobacco Use: Low Risk (04/28/2024)   Received from [facility]   Patient History    Smoking Tobacco Use: Never    Smokeless  Tobacco Use: Never    Passive Exposure: Not on file  Financial Resource Strain: Not on file  Food Insecurity: Low Risk (04/28/2024)   Received from [facility]   Epic    Within the past 12 months, the food you bought just didn't last and you didn't have money to get more. : Never true    Within the past 12 months, you worried that your food would run out before you got money to buy more: Never true  Transportation Needs: No Transportation Needs (04/28/2024)   Received from [facility]    In the past 12 months, has lack of reliable transportation kept you from medical appointments, meetings, work or from getting things needed for daily living? : No  Physical Activity: Not on file  Stress: Not on file  Social Connections: Not on file  Intimate Partner Violence: Not on file  Depression (EYV7-0): Not on file  Alcohol Screen: Not on file  Housing: Low Risk (04/28/2024)   Received from [facility]   Epic    What is your living situation today?: I have a steady place to live    Think about the place you live. Do you have problems with any of the following? Choose all that apply:: None/None on this list  Utilities: Low Risk (04/28/2024)   Received from [facility]   Utilities    In the past 12 months has the electric, gas, oil, or water company threatened to shut off services in your home? : No  Health Literacy: Not on file    Past Medical History, Surgical history, Social history, and Family history were reviewed and updated as appropriate.   Please see review of systems for further details on the patient's review from today.   Objective:   Physical Exam:  There were no vitals taken for this visit.  Physical Exam Constitutional:      General: He is not in acute distress. Musculoskeletal:        General: No deformity.  Neurological:     Mental Status: He is alert and oriented to person, place, and time.     Coordination: Coordination normal.   Psychiatric:        Attention and Perception: Attention and perception normal. He does not perceive auditory or visual hallucinations.        Mood and Affect: Mood normal. Mood is not anxious or depressed. Affect is not labile, blunt, angry or inappropriate.        Speech: Speech normal.        Behavior: Behavior normal.        Thought Content: Thought content normal. Thought content is not paranoid or delusional. Thought content does not include homicidal or suicidal ideation. Thought content does not include homicidal or suicidal plan.        Cognition and Memory: Cognition and memory normal.        Judgment: Judgment normal.     Comments:  Insight intact     Lab Review:     Component Value Date/Time   NA 133 (L) 02/17/2012 1154   K 3.5 02/17/2012 1154   CL 95 (L) 02/17/2012 1154   CO2 28 02/17/2012 1154   GLUCOSE 107 (H) 02/17/2012 1154   BUN 10 02/17/2012 1154   CREATININE 0.94 02/17/2012 1154   CALCIUM 9.1 02/17/2012 1154   PROT 7.5 02/17/2012 1154   ALBUMIN 4.2 02/17/2012 1154   AST 32 02/17/2012 1154   ALT 49 02/17/2012 1154   ALKPHOS 104 02/17/2012 1154   BILITOT 0.8 02/17/2012 1154   GFRNONAA >90 02/17/2012 1154   GFRAA >90 02/17/2012 1154       Component Value Date/Time   WBC 11.4 (H) 02/17/2012 1154   RBC 5.02 02/17/2012 1154   HGB 15.3 02/17/2012 1154   HCT 42.0 02/17/2012 1154   PLT 197 02/17/2012 1154   MCV 83.7 02/17/2012 1154   MCH 30.5 02/17/2012 1154   MCHC 36.4 (H) 02/17/2012 1154   RDW 12.6 02/17/2012 1154   LYMPHSABS 1.0 02/17/2012 1154   MONOABS 1.2 (H) 02/17/2012 1154   EOSABS 0.0 02/17/2012 1154   BASOSABS 0.0 02/17/2012 1154    No results found for: POCLITH, LITHIUM   No results found for: PHENYTOIN, PHENOBARB, VALPROATE, CBMZ   .res Assessment: Plan:    Plan:   1. Continue Lexapro  10mg  daily  Working with counselor bi-weekly.  RTC 6 months  10 minutes spent dedicated to the care of this patient on the date of this  encounter to include pre-visit review of records, ordering of medication, post visit documentation, and face-to-face time with the patient discussing GAD. Discussed continuing current medication regimen.  Patient advised to contact office with any questions, adverse effects, or acute worsening in signs and symptoms.   There are no diagnoses linked to this encounter.   Please see After Visit Summary for patient specific instructions.  Future Appointments  Date Time Provider Department Center  08/05/2024  9:30 AM Strider Vallance Nattalie, NP [facility] None    No orders of the defined types were placed in this encounter.     -------------------------------      [1] No Known Allergies

## 2025-02-02 ENCOUNTER — Telehealth: Admitting: Adult Health
